# Patient Record
Sex: Female | Born: 1990 | Race: Black or African American | Hispanic: No | Smoking: Never smoker
Health system: Southern US, Community
[De-identification: ages and names within clinical notes are randomized; demographics above are authoritative.]

## PROBLEM LIST (undated history)

## (undated) DIAGNOSIS — O039 Complete or unspecified spontaneous abortion without complication: Secondary | ICD-10-CM

## (undated) DIAGNOSIS — L309 Dermatitis, unspecified: Secondary | ICD-10-CM

## (undated) HISTORY — PX: NO PAST SURGERIES: SHX2092

## (undated) HISTORY — PX: WISDOM TOOTH EXTRACTION: SHX21

## (undated) HISTORY — DX: Dermatitis, unspecified: L30.9

---

## 2011-09-04 ENCOUNTER — Encounter: Payer: Self-pay | Admitting: *Deleted

## 2011-09-04 ENCOUNTER — Emergency Department (HOSPITAL_COMMUNITY)
Admission: EM | Admit: 2011-09-04 | Discharge: 2011-09-04 | Disposition: A | Discharge status: Home / Self Care | Payer: Medicaid Other | Attending: Emergency Medicine | Admitting: Emergency Medicine

## 2011-09-04 ENCOUNTER — Emergency Department (HOSPITAL_COMMUNITY): Payer: Medicaid Other

## 2011-09-04 DIAGNOSIS — M25561 Pain in right knee: Secondary | ICD-10-CM

## 2011-09-04 DIAGNOSIS — M25569 Pain in unspecified knee: Secondary | ICD-10-CM | POA: Insufficient documentation

## 2011-09-04 MED ORDER — IBUPROFEN 600 MG PO TABS: 600.0000 mg | ORAL_TABLET | Freq: Four times a day (QID) | ORAL | Status: AC | PRN

## 2011-09-04 NOTE — ED Provider Notes (Signed)
History     CSN: [id number]  Arrival date & time 09/04/11  1512   First MD Initiated Contact with Patient 09/04/11 1658      Chief Complaint  Patient presents with  . Knee Pain    (Consider location/radiation/quality/duration/timing/severity/associated sxs/prior treatment) Patient is a 21 y.o. female presenting with knee pain. The history is provided by the patient. No language interpreter was used.  Knee Pain This is a new problem. The current episode started in the past 7 days. The problem occurs intermittently. The problem has been gradually worsening. The symptoms are aggravated by bending and walking. She has tried rest for the symptoms. The treatment provided no relief.  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  Patient reports right knee pain for a week, developed swelling just proximal to kneecap yesterday.  No known injury.  History reviewed. No pertinent past medical history.  History reviewed. No pertinent past surgical history.  History reviewed. No pertinent family history.  History  Substance Use Topics  . Smoking status: Never Smoker   . Smokeless tobacco: Not on file  . Alcohol Use: No    OB History    Grav Para Term Preterm Abortions TAB SAB Ect Mult Living                  Review of Systems  All other systems reviewed and are negative.    Allergies  Review of patient's allergies indicates no known allergies.  Home Medications  No current outpatient prescriptions on file.  BP 113/66  Pulse 72  Temp(Src) 98.4 F (36.9 C) (Oral)  Resp 16  SpO2 100%  Physical Exam  Nursing note and vitals reviewed. Constitutional: She is oriented to person, place, and time. She appears well-developed and well-nourished.  HENT:  Head: Normocephalic and atraumatic.  Eyes: Conjunctivae are normal. Pupils are equal, round, and reactive to light.  Neck: Normal range of motion. Neck supple.  Cardiovascular: Normal rate, regular rhythm, normal  heart sounds and intact distal pulses.   Pulmonary/Chest: Effort normal and breath sounds normal.  Abdominal: Soft. Bowel sounds are normal.  Musculoskeletal: Normal range of motion. She exhibits tenderness.  Neurological: She is alert and oriented to person, place, and time.  Skin: Skin is warm and dry.  Psychiatric: She has a normal mood and affect.    ED Course  Procedures (including critical care time)  Labs Reviewed - No data to display No results found.   No diagnosis found.  7:23 PM Radiology results reviewed.  No joint effusion, fracture or lesion.   MDM          Jimmye Norman, NP 09/04/11 [phone]

## 2011-09-04 NOTE — ED Notes (Signed)
Pt reports one week of progressively worsening right knee pain. Pt reports right knee was swollen last night. On exam no swelling. Pt reports pain is constant. Pt reports increase in pain with walking, pt ambulatory in triage. Denies any injury.

## 2011-09-04 NOTE — ED Provider Notes (Signed)
Medical screening examination/treatment/procedure(s) were performed by non-physician practitioner and as supervising physician I was immediately available for consultation/collaboration.   Lannie Heaps, MD 09/04/11 2044 

## 2011-09-04 NOTE — ED Notes (Signed)
Denies injury but presents c/o anterior knee pain. Area tender to touch. Some swelling noted but not bruising. Pt ambulatory, cms/rom intact.

## 2011-09-04 NOTE — ED Notes (Signed)
Returned from xray

## 2011-09-04 NOTE — ED Notes (Signed)
Patient transported to X-ray 

## 2012-02-25 ENCOUNTER — Emergency Department (INDEPENDENT_AMBULATORY_CARE_PROVIDER_SITE_OTHER)
Admission: EM | Admit: 2012-02-25 | Discharge: 2012-02-25 | Disposition: A | Discharge status: Home / Self Care | Payer: Self-pay | Source: Home / Self Care | Attending: Emergency Medicine | Admitting: Emergency Medicine

## 2012-02-25 ENCOUNTER — Encounter (HOSPITAL_COMMUNITY): Payer: Self-pay

## 2012-02-25 DIAGNOSIS — H6690 Otitis media, unspecified, unspecified ear: Secondary | ICD-10-CM

## 2012-02-25 DIAGNOSIS — H6120 Impacted cerumen, unspecified ear: Secondary | ICD-10-CM

## 2012-02-25 MED ORDER — AMOXICILLIN 500 MG PO CAPS: 1000.0000 mg | ORAL_CAPSULE | Freq: Three times a day (TID) | ORAL | Status: AC

## 2012-02-25 NOTE — ED Provider Notes (Signed)
Chief Complaint  Patient presents with  . Otalgia    History of Present Illness:   Tamara Foster is a 21 year old female who has a one-day history of left ear pain and a one-week history of left ear congestion and decreased hearing. She denies any drainage from the ear. The right ear has been normal. She's had no nasal congestion, rhinorrhea, sore throat, cough, or fever. She denies any history of allergies.  Review of Systems:  Other than noted above, the patient denies any of the following symptoms: Systemic:  No fevers, chills, sweats, weight loss or gain, fatigue, or tiredness. Eye:  No redness, pain, discharge, itching, blurred vision, or diplopia. ENT:  No headache, nasal congestion, sneezing, itching, epistaxis, ear pain, congestion, decreased hearing, ringing in ears, vertigo, or tinnitus.  No oral lesions, sore throat, pain on swallowing, or hoarseness. Neck:  No mass, tenderness or adenopathy. Lungs:  No coughing, wheezing, or shortness of breath. Skin:  No rash or itching.  PMFSH:  Past medical history, family history, social history, meds, and allergies were reviewed.  Physical Exam:   Vital signs:  BP 105/63  Pulse 65  Temp 98.5 F (36.9 C) (Oral)  Resp 16  SpO2 100%  LMP 02/06/2012 General:  Alert and oriented.  In no distress.  Skin warm and dry. Eye:  PERRL, full EOMs, lids and conjunctiva normal.   ENT:  Both TMs were occluded by cerumen. This was irrigated clear and thereafter the left TM was red and dull, right TM was normal.  Nasal mucosa not congested and without drainage.  Mucous membranes moist, no oral lesions, normal dentition, pharynx clear.  No cranial or facial pain to palplation. Neck:  Supple, full ROM.  No adenopathy, tenderness or mass.  Thyroid normal. Lungs:  Breath sounds clear and equal bilaterally.  No wheezes, rales or rhonchi. Heart:  Rhythm regular, without extrasystoles.  No gallops or murmers. Skin:  Clear, warm and dry.  Assessment:  The primary  encounter diagnosis was Otitis media. A diagnosis of Cerumen impaction was also pertinent to this visit.  Plan:   1.  The following meds were prescribed:   New Prescriptions   AMOXICILLIN (AMOXIL) 500 MG CAPSULE    Take 2 capsules (1,000 mg total) by mouth 3 (three) times daily.   2.  The patient was instructed in symptomatic care and handouts were given. 3.  The patient was told to return if becoming worse in any way, if no better in 3 or 4 days, and given some red flag symptoms that would indicate earlier return.  Follow up:  The patient was told to follow up here if no better in 2 weeks.       Reuben Likes, MD 02/25/12 [phone]

## 2012-02-25 NOTE — ED Notes (Signed)
C/o pain and decreased hearing to lt ear for 1 week.  Denies fever or cold sx.

## 2012-02-25 NOTE — Discharge Instructions (Signed)
Cerumen Impaction A cerumen impaction is when the wax in your ear forms a plug. This plug usually causes reduced hearing. Sometimes it also causes an earache or dizziness. Removing a cerumen impaction can be difficult and painful. The wax sticks to the ear canal. The canal is sensitive and bleeds easily. If you try to remove a heavy wax buildup with a cotton tipped swab, you may push it in further. Irrigation with water, suction, and small ear curettes may be used to clear out the wax. If the impaction is fixed to the skin in the ear canal, ear drops may be needed for a few days to loosen the wax. People who build up a lot of wax frequently can use ear wax removal products available in your local drugstore. SEEK MEDICAL CARE IF:  You develop an earache, increased hearing loss, or marked dizziness. Document Released: 09/20/2004 Document Revised: 08/02/2011 Document Reviewed: 11/10/2009 [facility] Patient Information 2012 [place], [place].  Otitis Media, Adult A middle ear infection is an infection in the space behind the eardrum. The medical name for this is "otitis media." It may happen after a common cold. It is caused by a germ that starts growing in that space. You may feel swollen glands in your neck on the side of the ear infection. HOME CARE INSTRUCTIONS   Take your medicine as directed until it is gone, even if you feel better after the first few days.   Only take over-the-counter or prescription medicines for pain, discomfort, or fever as directed by your caregiver.   Occasional use of a nasal decongestant a couple times per day may help with discomfort and help the eustachian tube to drain better.  Follow up with your caregiver in 10 to 14 days or as directed, to be certain that the infection has cleared. Not keeping the appointment could result in a chronic or permanent injury, pain, hearing loss and disability. If there is any problem keeping the appointment, you must call back to this  facility for assistance. SEEK IMMEDIATE MEDICAL CARE IF:   You are not getting better in 2 to 3 days.   You have pain that is not controlled with medication.   You feel worse instead of better.   You cannot use the medication as directed.   You develop swelling, redness or pain around the ear or stiffness in your neck.  MAKE SURE YOU:   Understand these instructions.   Will watch your condition.   Will get help right away if you are not doing well or get worse.  Document Released: 05/18/2004 Document Revised: 08/02/2011 Document Reviewed: 03/19/2008 [facility] Patient Information 2012 [place], [place].

## 2012-07-02 LAB — OB RESULTS CONSOLE HIV ANTIBODY (ROUTINE TESTING): HIV: NONREACTIVE

## 2012-07-02 LAB — OB RESULTS CONSOLE GC/CHLAMYDIA
Chlamydia: POSITIVE
Gonorrhea: NEGATIVE

## 2012-07-02 LAB — OB RESULTS CONSOLE ABO/RH
ABO/RH(D): NEGATIVE
RH Type: NEGATIVE

## 2012-07-02 LAB — OB RESULTS CONSOLE RPR: RPR: NONREACTIVE

## 2012-07-02 LAB — OB RESULTS CONSOLE RUBELLA ANTIBODY, IGM: Rubella: IMMUNE

## 2012-07-02 LAB — OB RESULTS CONSOLE HEPATITIS B SURFACE ANTIGEN: Hepatitis B Surface Ag: NEGATIVE

## 2012-07-02 LAB — OB RESULTS CONSOLE ANTIBODY SCREEN: Antibody Screen: NEGATIVE

## 2012-08-18 LAB — OB RESULTS CONSOLE GC/CHLAMYDIA: Chlamydia: NEGATIVE

## 2012-12-18 ENCOUNTER — Encounter (HOSPITAL_COMMUNITY): Payer: Self-pay | Admitting: *Deleted

## 2012-12-18 ENCOUNTER — Inpatient Hospital Stay (HOSPITAL_COMMUNITY)
Admission: AD | Admit: 2012-12-18 | Discharge: 2012-12-18 | Disposition: A | Discharge status: Home / Self Care | Payer: Medicaid Other | Source: Ambulatory Visit | Attending: Obstetrics and Gynecology | Admitting: Obstetrics and Gynecology

## 2012-12-18 ENCOUNTER — Telehealth (HOSPITAL_COMMUNITY): Payer: Self-pay | Admitting: *Deleted

## 2012-12-18 DIAGNOSIS — O479 False labor, unspecified: Secondary | ICD-10-CM | POA: Insufficient documentation

## 2012-12-18 DIAGNOSIS — O368190 Decreased fetal movements, unspecified trimester, not applicable or unspecified: Secondary | ICD-10-CM | POA: Insufficient documentation

## 2012-12-18 DIAGNOSIS — O26859 Spotting complicating pregnancy, unspecified trimester: Secondary | ICD-10-CM | POA: Insufficient documentation

## 2012-12-18 NOTE — MAU Note (Signed)
Pt reports she is cramping and spotting. States cramping all day, spotting since 1pm. Denies problems with pregnancy

## 2012-12-18 NOTE — Telephone Encounter (Signed)
Preadmission screen  

## 2012-12-18 NOTE — MAU Note (Signed)
Pt states her vaginal bleeding is brownish in color -no discharge present at this time-pad in place

## 2012-12-18 NOTE — MAU Note (Signed)
Pt also instructed to call the office in the morning if she still has not felt the baby move

## 2012-12-18 NOTE — MAU Provider Note (Signed)
  History     CSN: [id number]  Arrival date and time: 12/18/12 1938   None     Chief Complaint  Patient presents with  . Labor Eval   HPI  Tamara Foster is a 22 y.o. G1P0 at [redacted]w[redacted]d who presents today with decreased fetal movement, contractions and brown spotting. She states that she had not felt the baby move since 1600 today, but she is feeling it now. She is also having some irregular UCs. She was checked yesterday in the office and was FT. She is now having brown spotting. She denies any LOF.   Past Medical History  Diagnosis Date  . Eczema     Past Surgical History  Procedure Laterality Date  . No past surgeries      Family History  Problem Relation Age of Onset  . Cancer Father     bone  . Diabetes Father   . Cancer Paternal Aunt     breast    History  Substance Use Topics  . Smoking status: Never Smoker   . Smokeless tobacco: Not on file  . Alcohol Use: No    Allergies: No Known Allergies  Prescriptions prior to admission  Medication Sig Dispense Refill  . Prenatal Vit-Fe Fumarate-FA (MULTIVITAMIN-PRENATAL) 27-0.8 MG TABS Take 1 tablet by mouth daily at 12 noon.        Review of Systems  Eyes: Negative for blurred vision.  Gastrointestinal: Negative for nausea, vomiting, abdominal pain, diarrhea and constipation.  Genitourinary: Negative for dysuria, urgency and frequency.  Neurological: Negative for headaches.   Physical Exam   Blood pressure 137/85, pulse 86, temperature 98.6 F (37 C), temperature source Oral, resp. rate 18, height 5\' 5"  (1.651 m), weight 84.823 kg (187 lb), last menstrual period 03/13/2012, SpO2 100.00%.  Physical Exam  Nursing note and vitals reviewed. Constitutional: She is oriented to person, place, and time. She appears well-developed and well-nourished. No distress.  Cardiovascular: Normal rate.   Respiratory: Effort normal.  GI: Soft.  Genitourinary:   External: No lesion Vagina: small amount of brown mucous-y blood  seen Cervix: FT/60/0 Uterus: AGA FHT: Cat I Toco: q 7-10 min ctx  Neurological: She is alert and oriented to person, place, and time.  Skin: Skin is warm and dry.  Psychiatric: She has a normal mood and affect.    MAU Course  Procedures    Assessment and Plan  Pt stable and ready for d/c home  Tawnya Crook 12/18/2012, 8:37 PM    Strip reviewed and shows good variability and accels.  Category 1. Baseline 130 with accels to 150-160 Huel Cote, MD

## 2012-12-19 ENCOUNTER — Inpatient Hospital Stay (HOSPITAL_COMMUNITY)
Admission: AD | Admit: 2012-12-19 | Discharge: 2012-12-19 | Disposition: A | Discharge status: Home / Self Care | Payer: Medicaid Other | Source: Ambulatory Visit | Attending: Obstetrics and Gynecology | Admitting: Obstetrics and Gynecology

## 2012-12-19 ENCOUNTER — Encounter (HOSPITAL_COMMUNITY): Payer: Self-pay | Admitting: *Deleted

## 2012-12-19 DIAGNOSIS — O479 False labor, unspecified: Secondary | ICD-10-CM | POA: Insufficient documentation

## 2012-12-19 MED ORDER — ZOLPIDEM TARTRATE 5 MG PO TABS
5.0000 mg | ORAL_TABLET | Freq: Once | ORAL | Status: AC
  Administered 2012-12-19: 5 mg via ORAL
  Filled 2012-12-19: qty 1

## 2012-12-19 NOTE — MAU Note (Signed)
Pt states her contractions are closer and harder

## 2012-12-19 NOTE — MAU Note (Signed)
Dr Senaida Ores notified of pt status and FHTtracing and SVE-to recheck in 1.5 hrs

## 2012-12-20 ENCOUNTER — Encounter (HOSPITAL_COMMUNITY): Payer: Self-pay | Admitting: Anesthesiology

## 2012-12-20 ENCOUNTER — Encounter (HOSPITAL_COMMUNITY): Payer: Self-pay | Admitting: *Deleted

## 2012-12-20 ENCOUNTER — Inpatient Hospital Stay (HOSPITAL_COMMUNITY)
Admission: AD | Admit: 2012-12-20 | Discharge: 2012-12-22 | DRG: 775 | Disposition: A | Discharge status: Home / Self Care | Payer: Medicaid Other | Source: Ambulatory Visit | Attending: Obstetrics and Gynecology | Admitting: Obstetrics and Gynecology

## 2012-12-20 ENCOUNTER — Inpatient Hospital Stay (HOSPITAL_COMMUNITY): Payer: Medicaid Other | Admitting: Anesthesiology

## 2012-12-20 LAB — CBC
HCT: 31.9 % — ABNORMAL LOW (ref 36.0–46.0)
Hemoglobin: 10.2 g/dL — ABNORMAL LOW (ref 12.0–15.0)
MCH: 23.5 pg — ABNORMAL LOW (ref 26.0–34.0)
MCHC: 32 g/dL (ref 30.0–36.0)
MCV: 73.5 fL — ABNORMAL LOW (ref 78.0–100.0)
Platelets: 167 10*3/uL (ref 150–400)
RBC: 4.34 MIL/uL (ref 3.87–5.11)
RDW: 14.8 % (ref 11.5–15.5)
WBC: 8 10*3/uL (ref 4.0–10.5)

## 2012-12-20 LAB — GROUP B STREP BY PCR: Group B strep by PCR: NEGATIVE

## 2012-12-20 LAB — RPR: RPR Ser Ql: NONREACTIVE

## 2012-12-20 LAB — OB RESULTS CONSOLE GBS: GBS: NEGATIVE

## 2012-12-20 LAB — ABO/RH: ABO/RH(D): B NEG

## 2012-12-20 MED ORDER — LACTATED RINGERS IV SOLN
500.0000 mL | INTRAVENOUS | Status: DC | PRN
  Administered 2012-12-20: 1000 mL via INTRAVENOUS

## 2012-12-20 MED ORDER — ZOLPIDEM TARTRATE 5 MG PO TABS: 5.0000 mg | ORAL_TABLET | Freq: Every evening | ORAL | Status: DC | PRN

## 2012-12-20 MED ORDER — EPHEDRINE 5 MG/ML INJ
10.0000 mg | INTRAVENOUS | Status: DC | PRN
  Filled 2012-12-20: qty 4
  Filled 2012-12-20: qty 2

## 2012-12-20 MED ORDER — CITRIC ACID-SODIUM CITRATE 334-500 MG/5ML PO SOLN: 30.0000 mL | ORAL | Status: DC | PRN

## 2012-12-20 MED ORDER — IBUPROFEN 600 MG PO TABS: 600.0000 mg | ORAL_TABLET | Freq: Four times a day (QID) | ORAL | Status: DC

## 2012-12-20 MED ORDER — TERBUTALINE SULFATE 1 MG/ML IJ SOLN: 0.2500 mg | Freq: Once | INTRAMUSCULAR | Status: DC | PRN

## 2012-12-20 MED ORDER — LACTATED RINGERS IV SOLN: 500.0000 mL | Freq: Once | INTRAVENOUS | Status: DC

## 2012-12-20 MED ORDER — EPHEDRINE 5 MG/ML INJ
10.0000 mg | INTRAVENOUS | Status: DC | PRN
  Filled 2012-12-20: qty 2

## 2012-12-20 MED ORDER — COMPLETENATE 29-1 MG PO CHEW
1.0000 | CHEWABLE_TABLET | Freq: Every day | ORAL | Status: DC
  Administered 2012-12-21: 1 via ORAL
  Filled 2012-12-20 (×4): qty 1

## 2012-12-20 MED ORDER — FLEET ENEMA 7-19 GM/118ML RE ENEM: 1.0000 | ENEMA | RECTAL | Status: DC | PRN

## 2012-12-20 MED ORDER — ONDANSETRON HCL 4 MG/2ML IJ SOLN: 4.0000 mg | Freq: Four times a day (QID) | INTRAMUSCULAR | Status: DC | PRN

## 2012-12-20 MED ORDER — WITCH HAZEL-GLYCERIN EX PADS: 1.0000 "application " | MEDICATED_PAD | CUTANEOUS | Status: DC | PRN

## 2012-12-20 MED ORDER — IBUPROFEN 600 MG PO TABS: 600.0000 mg | ORAL_TABLET | Freq: Four times a day (QID) | ORAL | Status: DC | PRN

## 2012-12-20 MED ORDER — DIBUCAINE 1 % RE OINT: 1.0000 "application " | TOPICAL_OINTMENT | RECTAL | Status: DC | PRN

## 2012-12-20 MED ORDER — OXYTOCIN 40 UNITS IN LACTATED RINGERS INFUSION - SIMPLE MED: 62.5000 mL/h | INTRAVENOUS | Status: DC

## 2012-12-20 MED ORDER — SIMETHICONE 80 MG PO CHEW: 80.0000 mg | CHEWABLE_TABLET | ORAL | Status: DC | PRN

## 2012-12-20 MED ORDER — BENZOCAINE-MENTHOL 20-0.5 % EX AERO
1.0000 "application " | INHALATION_SPRAY | CUTANEOUS | Status: DC | PRN
  Administered 2012-12-21: 1 via TOPICAL
  Filled 2012-12-20 (×3): qty 56

## 2012-12-20 MED ORDER — MEASLES, MUMPS & RUBELLA VAC ~~LOC~~ INJ
0.5000 mL | INJECTION | Freq: Once | SUBCUTANEOUS | Status: DC
  Filled 2012-12-20: qty 0.5

## 2012-12-20 MED ORDER — LIDOCAINE HCL (PF) 1 % IJ SOLN
30.0000 mL | INTRAMUSCULAR | Status: DC | PRN
  Filled 2012-12-20 (×2): qty 30

## 2012-12-20 MED ORDER — OXYCODONE-ACETAMINOPHEN 5-325 MG PO TABS: 1.0000 | ORAL_TABLET | ORAL | Status: DC | PRN

## 2012-12-20 MED ORDER — ONDANSETRON HCL 4 MG/2ML IJ SOLN: 4.0000 mg | INTRAMUSCULAR | Status: DC | PRN

## 2012-12-20 MED ORDER — PHENYLEPHRINE 40 MCG/ML (10ML) SYRINGE FOR IV PUSH (FOR BLOOD PRESSURE SUPPORT)
80.0000 ug | PREFILLED_SYRINGE | INTRAVENOUS | Status: DC | PRN
  Filled 2012-12-20: qty 2

## 2012-12-20 MED ORDER — PRENATAL 27-0.8 MG PO TABS
1.0000 | ORAL_TABLET | Freq: Every day | ORAL | Status: DC
  Filled 2012-12-20 (×4): qty 1

## 2012-12-20 MED ORDER — IBUPROFEN 100 MG/5ML PO SUSP
600.0000 mg | Freq: Four times a day (QID) | ORAL | Status: DC | PRN
  Administered 2012-12-20 – 2012-12-22 (×7): 600 mg via ORAL
  Filled 2012-12-20 (×7): qty 30

## 2012-12-20 MED ORDER — LIDOCAINE HCL (PF) 1 % IJ SOLN
INTRAMUSCULAR | Status: DC | PRN
  Administered 2012-12-20 (×2): 5 mL

## 2012-12-20 MED ORDER — PRENATAL MULTIVITAMIN CH: 1.0000 | ORAL_TABLET | Freq: Every day | ORAL | Status: DC

## 2012-12-20 MED ORDER — LANOLIN HYDROUS EX OINT: TOPICAL_OINTMENT | CUTANEOUS | Status: DC | PRN

## 2012-12-20 MED ORDER — FENTANYL 2.5 MCG/ML BUPIVACAINE 1/10 % EPIDURAL INFUSION (WH - ANES)
14.0000 mL/h | INTRAMUSCULAR | Status: DC | PRN
  Administered 2012-12-20: 14 mL/h via EPIDURAL
  Filled 2012-12-20: qty 125

## 2012-12-20 MED ORDER — SENNOSIDES-DOCUSATE SODIUM 8.6-50 MG PO TABS: 2.0000 | ORAL_TABLET | Freq: Every day | ORAL | Status: DC

## 2012-12-20 MED ORDER — DIPHENHYDRAMINE HCL 25 MG PO CAPS: 25.0000 mg | ORAL_CAPSULE | Freq: Four times a day (QID) | ORAL | Status: DC | PRN

## 2012-12-20 MED ORDER — ACETAMINOPHEN 325 MG PO TABS: 650.0000 mg | ORAL_TABLET | ORAL | Status: DC | PRN

## 2012-12-20 MED ORDER — ONDANSETRON HCL 4 MG PO TABS: 4.0000 mg | ORAL_TABLET | ORAL | Status: DC | PRN

## 2012-12-20 MED ORDER — LACTATED RINGERS IV SOLN
INTRAVENOUS | Status: DC
  Administered 2012-12-20: 125 mL/h via INTRAVENOUS

## 2012-12-20 MED ORDER — DIPHENHYDRAMINE HCL 50 MG/ML IJ SOLN: 12.5000 mg | INTRAMUSCULAR | Status: DC | PRN

## 2012-12-20 MED ORDER — OXYTOCIN 40 UNITS IN LACTATED RINGERS INFUSION - SIMPLE MED: 62.5000 mL/h | INTRAVENOUS | Status: AC | PRN

## 2012-12-20 MED ORDER — PHENYLEPHRINE 40 MCG/ML (10ML) SYRINGE FOR IV PUSH (FOR BLOOD PRESSURE SUPPORT)
80.0000 ug | PREFILLED_SYRINGE | INTRAVENOUS | Status: DC | PRN
  Filled 2012-12-20: qty 2
  Filled 2012-12-20: qty 5

## 2012-12-20 MED ORDER — OXYTOCIN BOLUS FROM INFUSION: 500.0000 mL | INTRAVENOUS | Status: DC

## 2012-12-20 MED ORDER — TETANUS-DIPHTH-ACELL PERTUSSIS 5-2.5-18.5 LF-MCG/0.5 IM SUSP: 0.5000 mL | Freq: Once | INTRAMUSCULAR | Status: DC

## 2012-12-20 MED ORDER — LACTATED RINGERS IV SOLN: INTRAVENOUS | Status: AC

## 2012-12-20 MED ORDER — OXYTOCIN 40 UNITS IN LACTATED RINGERS INFUSION - SIMPLE MED
1.0000 m[IU]/min | INTRAVENOUS | Status: DC
  Administered 2012-12-20: 1 m[IU]/min via INTRAVENOUS
  Filled 2012-12-20: qty 1000

## 2012-12-20 NOTE — Anesthesia Procedure Notes (Signed)
Epidural Patient location during procedure: OB Start time: 12/20/2012 8:06 AM  Staffing Anesthesiologist: Angus Seller., Harrell Gave. Performed by: anesthesiologist   Preanesthetic Checklist Completed: patient identified, site marked, surgical consent, pre-op evaluation, timeout performed, IV checked, risks and benefits discussed and monitors and equipment checked  Epidural Patient position: sitting Prep: site prepped and draped and DuraPrep Patient monitoring: continuous pulse ox and blood pressure Approach: midline Injection technique: LOR air and LOR saline  Needle:  Needle type: Tuohy  Needle gauge: 17 G Needle length: 9 cm and 9 Needle insertion depth: 6 cm Catheter type: closed end flexible Catheter size: 19 Gauge Catheter at skin depth: 12 cm Test dose: negative  Assessment Events: blood not aspirated, injection not painful, no injection resistance, negative IV test and no paresthesia  Additional Notes Patient identified.  Risk benefits discussed including failed block, incomplete pain control, headache, nerve damage, paralysis, blood pressure changes, nausea, vomiting, reactions to medication both toxic or allergic, and postpartum back pain.  Patient expressed understanding and wished to proceed.  All questions were answered.  Sterile technique used throughout procedure and epidural site dressed with sterile barrier dressing. No paresthesia or other complications noted.The patient did not experience any signs of intravascular injection such as tinnitus or metallic taste in mouth nor signs of intrathecal spread such as rapid motor block. Please see nursing notes for vital signs.

## 2012-12-20 NOTE — Progress Notes (Signed)
Lactation consultant  Called and requested to assist pt

## 2012-12-20 NOTE — Anesthesia Preprocedure Evaluation (Signed)

## 2012-12-20 NOTE — H&P (Signed)
Tamara Foster, Tamara Foster               ACCOUNT NO.:  [id number]  MEDICAL RECORD NO.:  [id number]  LOCATION:  9167                          FACILITY:  [facility]  PHYSICIAN:  Malachi Pro. Ambrose Mantle, M.D. DATE OF BIRTH:  02/27/1991  DATE OF ADMISSION:  12/20/2012 DATE OF DISCHARGE:                             HISTORY & PHYSICAL   HISTORY OF PRESENT ILLNESS:  This is a 22 year old black female, para 0, gravida 1 with [facility] December 18, 2012, admitted in early labor.  Blood group and type B negative with a negative antibody.  Pap smear normal. Rubella immune.  RPR nonreactive.  Urine culture negative.  Hepatitis B surface antigen negative, HIV negative.  GC and Chlamydia:  GC was negative.  Chlamydia positive treated with Zithromax 1 g.  Hemoglobin AA.  Quad screen negative.  One-hour Glucola was 119.  The patient does not know her group B strep status, and we do not find it in the prenatal record.  PAST MEDICAL HISTORY:  Eczema.  SURGICAL HISTORY:  None.  ALLERGIES:  No known allergies.  No latex allergy.  FAMILY HISTORY:  Father 39 with bone cancer and 40 breast cancer. Father at 71 diabetes.  The patient states that she never smoked cigarettes, never used drugs.  Formerly drank some alcohol.  PHYSICAL EXAMINATION:  VITAL SIGNS:  On admission, temperature 97.4, pulse 78, respirations 16, blood pressure 115/53. HEART:  Normal size and sounds.  No murmurs. LUNGS:  Clear to auscultation. GU:  Fundal height compatible with dates.  Cervix 4 cm, 100% vertex at a -1 to 0 station.  Artificial rupture of the membranes produced clear fluid.  ADMITTING IMPRESSION:  Intrauterine pregnancy at 40 weeks, early labor. This patient had been seen in maternity admission unit twice on Thursday night and early Friday morning.  She came to our office Friday during the day, all with false labor.  No cervical progression, but when she came to maternity admission unit today, the cervix had definitely changed to 3-4 cm  per the RN on duty.  The patient had an unknown group B strep status for term pregnancy with a known group B strep status there, treated with penicillin only.  If there are other factors that put her at high risk, which she has none, so rapid group B strep was done and was negative.  The patient has received an epidural.  I have ruptured her membranes with clear fluid, and she has been started on Pitocin to see if we can get adequate cervical progression.     Malachi Pro. Ambrose Mantle, M.D.     TFH/MEDQ  D:  12/20/2012  T:  12/20/2012  Job:  [id number]

## 2012-12-20 NOTE — Progress Notes (Signed)
Patient ID: Tamara Foster, female   DOB: 1990/10/16, 22 y.o.   MRN: [id number] Delivery note:  The progressed to full dilatation and pushed well. She delivered spontaneously ROA over a second degree ML laceration a living female infant with Apgars 8 and 9 at 1 and 5 minutes. The placenta was intact and the uterus was normal. The laceration was repaired with 3-0 vicryl. The uterus was firm and well contracted but continued to bleed some so I massaged it for several minutes. 1% xylocaine was used for the repair. EBL 400 cc's.

## 2012-12-20 NOTE — MAU Note (Signed)
Pt states has been contracting allnight and has not slept

## 2012-12-20 NOTE — Progress Notes (Signed)
Lactation consultant here

## 2012-12-21 LAB — CBC
HCT: 28.5 % — ABNORMAL LOW (ref 36.0–46.0)
Hemoglobin: 9.3 g/dL — ABNORMAL LOW (ref 12.0–15.0)
MCH: 24.2 pg — ABNORMAL LOW (ref 26.0–34.0)
MCHC: 32.6 g/dL (ref 30.0–36.0)
MCV: 74.2 fL — ABNORMAL LOW (ref 78.0–100.0)
Platelets: 139 10*3/uL — ABNORMAL LOW (ref 150–400)
RBC: 3.84 MIL/uL — ABNORMAL LOW (ref 3.87–5.11)
RDW: 15 % (ref 11.5–15.5)
WBC: 9.4 10*3/uL (ref 4.0–10.5)

## 2012-12-21 MED ORDER — RHO D IMMUNE GLOBULIN 1500 UNIT/2ML IJ SOLN
300.0000 ug | Freq: Once | INTRAMUSCULAR | Status: AC
  Administered 2012-12-21: 300 ug via INTRAMUSCULAR
  Filled 2012-12-21: qty 2

## 2012-12-21 MED ORDER — OXYCODONE-ACETAMINOPHEN 5-325 MG/5ML PO SOLN
5.0000 mL | ORAL | Status: DC | PRN
  Administered 2012-12-21 – 2012-12-22 (×3): 5 mL via ORAL
  Filled 2012-12-21 (×3): qty 5

## 2012-12-21 NOTE — Anesthesia Postprocedure Evaluation (Signed)
  Anesthesia Post-op Note  Patient: Tamara Foster  Procedure(s) Performed: * No procedures listed *  Patient Location: PACU and Mother/Baby  Anesthesia Type:Epidural  Level of Consciousness: awake, alert  and oriented  Airway and Oxygen Therapy: Patient Spontanous Breathing  Post-op Pain: mild  Post-op Assessment: Patient's Cardiovascular Status Stable, Respiratory Function Stable, No signs of Nausea or vomiting, Adequate PO intake and Pain level controlled  Post-op Vital Signs: stable  Complications: No apparent anesthesia complications

## 2012-12-21 NOTE — Progress Notes (Signed)
Patient ID: Tamara Foster, female   DOB: 08-Feb-1991, 22 y.o.   MRN: [id number] #1 afebrile BP normal No complaints

## 2012-12-22 ENCOUNTER — Inpatient Hospital Stay (HOSPITAL_COMMUNITY): Admission: RE | Admit: 2012-12-22 | Payer: Medicaid Other | Source: Ambulatory Visit

## 2012-12-22 LAB — RH IG WORKUP (INCLUDES ABO/RH)
ABO/RH(D): B NEG
Antibody Screen: NEGATIVE
Fetal Screen: NEGATIVE
Gestational Age(Wks): 40.2
Unit division: 0

## 2012-12-22 MED ORDER — OXYCODONE-ACETAMINOPHEN 5-325 MG/5ML PO SOLN: 5.0000 mL | Freq: Four times a day (QID) | ORAL | Status: DC | PRN

## 2012-12-22 MED ORDER — IBUPROFEN 100 MG/5ML PO SUSP: 600.0000 mg | Freq: Four times a day (QID) | ORAL | Status: DC | PRN

## 2012-12-22 NOTE — Progress Notes (Signed)
Ur chart review completed.  

## 2012-12-22 NOTE — Progress Notes (Signed)
Patient ID: Tamara Foster, female   DOB: 1991/08/27, 22 y.o.   MRN: [id number] #[id number] afebrile No problems ready for d/c.

## 2012-12-26 ENCOUNTER — Ambulatory Visit (HOSPITAL_COMMUNITY)
Admit: 2012-12-26 | Discharge: 2012-12-26 | Disposition: A | Discharge status: Home / Self Care | Payer: Medicaid Other | Attending: Obstetrics and Gynecology | Admitting: Obstetrics and Gynecology

## 2012-12-26 NOTE — Lactation Note (Signed)
Adult Lactation Consultation Outpatient Visit Note  Patient Name: Tamara Foster  Baby's name: Ava Pate Date of Birth: 05/15/1991   BW: 7# 13.2 oz Gestational Age at Delivery: Unknown Today's weight: 7# 9.5oz Type of Delivery: Vag  Breastfeeding History: Frequency of Breastfeeding: N/A. l  Length of Feeding:  Voids: light yellow Stools: yellow, brownish scant amount today (1-today, 2 the day before)  Supplementing / Method: Pumping:  Type of Pump: hand pump   Frequency: 20-30 min on each side; just began  Volume: scant amount  Comments: Formula 3 oz q3h   Consultation Evaluation:  Initial Feeding Assessment: Pre-feed Weight: 3444g Post-feed Weight: 3460g Amount Transferred:47mL (some of which was formula) Comments:R breast, w/size 20 NS; 0925; 0930: curved tip syringe: 0935: formula; 0945: off   Total Supplement Given: 70 mL+ (there had been spillage w/SNS)   Follow-Up Baby has gained 5.5 oz over the last 2 days.  Baby has not fed at the breast since discharge (Mom attempted once at home, but she wouldn't latch).  Mom has been formula feeding.  Mom went 2 days without any breast stimulation & Mom has only just begun using a hand pump.  Mom reports that she did feel as if her milk came in, but now her breasts are soft.  Mom only gets a scant amount w/the hand pump. Mom's meds are significant for: percocet 1 q4-6h prn, which she takes for back pain.   Mom has flat nipples & baby was put to breast w/a size 20 nipple shield.  Baby latched only briefly, but became fussy secondary to lack of flow.  The nipple-shield was prefilled w/formula & baby did latch & suckle briefly, but only until that formula had been consumed.  An SNS was attempted, but this feeding did not work well and Mom did not seem to be a good candidate for the SNS at this time.    Based on this feeding & baby's risk for toxic levels of jaundice (b/c of the cephalohematoma) it seemed more prudent to continue feeding  baby via bottle at this time.  Mom understands that she may have missed the window for a milk supply, but we will attempt to regain it by pumping 8 times/day & not allowing more than 5 hours to elapse between pumping sessions. Mom given a [facility] loaner ([employer]) & shown how to use, clean, and assemble parts.  Recipe for lactation cookies also shared.   Mom is to call for a f/u lactation appt once baby's risk for jaundice has decreased.  Mom is aware that any breastmilk pumped should be given to the baby, but not mixed w/formula.     Lurline Hare [facility] : [facility] 12/26/2012, 9:05 AM

## 2013-02-03 NOTE — Discharge Summary (Signed)
NAMEESBEYDI, Tamara Foster               ACCOUNT NO.:  [id number]  MEDICAL RECORD NO.:  [id number]  LOCATION:  9135                          FACILITY:  [facility]  PHYSICIAN:  Malachi Pro. Ambrose Mantle, M.D. DATE OF BIRTH:  05/20/1991  DATE OF ADMISSION:  12/20/2012 DATE OF DISCHARGE:  12/22/2012                              DISCHARGE SUMMARY   HISTORY OF PRESENT ILLNESS:  A 22 year old black female para 0, gravida 1, EDC December 18, 2012, admitted in early labor.  The patient had been treated for Chlamydia during pregnancy.  She had been seen in the maternity admission unit twice on 2 days prior to admission and once 1 day prior to admission.  She came to our office during the day, all with false labor.  There was no cervical progression.  When she came to the maternity admission unit on the day of delivery, the cervix had definitely changed to 3-4 cm per the RN on duty.  A rapid group B strep was done.  The patient began labor.  She was started on Pitocin.  She progressed to full dilatation and pushed well.  She delivered spontaneously, ROA over a second-degree midline laceration by Dr. Ambrose Mantle, a living female infant with Apgars of 8 and 9 at 1 and 5 minutes.  Placenta was intact.  Uterus was normal.  Laceration repaired with 3-0 Vicryl.  Uterus was firm and well contracted.  But continued to bleed some, so I massaged for several minutes.  Xylocaine 1% was used for repair.  Blood loss about 400 mL.  Postpartum, the patient did well, had no significant problems and on the second postpartum day was ready for discharge.  Hemoglobin was 9.3, hematocrit 28.5, white count 9400, platelet count 139,000.  Blood group and type B negative, negative antibody.  The patient was given RhoGAM, and on the second postpartum day was ready for discharge.  FINAL DIAGNOSES:  Intrauterine pregnancy at 40+ weeks.  Delivered ROA.  OPERATION:  Spontaneous delivery ROA, repair of second-degree midline laceration.  FINAL  CONDITION:  Improved.  INSTRUCTIONS:  A regular discharge instruction booklet as well as the after visit summary.  She was also given prescriptions for analgesics. She is asked to return to the office in 6 weeks for followup examination and take prenatal vitamins as well as ferrous sulfate 325 mg twice daily.     Malachi Pro. Ambrose Mantle, M.D.     TFH/MEDQ  D:  02/02/2013  T:  02/03/2013  Job:  [id number]

## 2013-04-28 ENCOUNTER — Emergency Department (HOSPITAL_COMMUNITY)
Admission: EM | Admit: 2013-04-28 | Discharge: 2013-04-29 | Disposition: A | Discharge status: Home / Self Care | Payer: Medicaid Other | Attending: Emergency Medicine | Admitting: Emergency Medicine

## 2013-04-28 ENCOUNTER — Emergency Department (HOSPITAL_COMMUNITY): Payer: Medicaid Other

## 2013-04-28 ENCOUNTER — Encounter (HOSPITAL_COMMUNITY): Payer: Self-pay | Admitting: Emergency Medicine

## 2013-04-28 DIAGNOSIS — Z872 Personal history of diseases of the skin and subcutaneous tissue: Secondary | ICD-10-CM | POA: Insufficient documentation

## 2013-04-28 DIAGNOSIS — R0789 Other chest pain: Secondary | ICD-10-CM | POA: Insufficient documentation

## 2013-04-28 DIAGNOSIS — F419 Anxiety disorder, unspecified: Secondary | ICD-10-CM

## 2013-04-28 DIAGNOSIS — F411 Generalized anxiety disorder: Secondary | ICD-10-CM | POA: Insufficient documentation

## 2013-04-28 DIAGNOSIS — R0602 Shortness of breath: Secondary | ICD-10-CM | POA: Insufficient documentation

## 2013-04-28 LAB — CBC WITH DIFFERENTIAL/PLATELET
Basophils Absolute: 0 10*3/uL (ref 0.0–0.1)
Basophils Relative: 0 % (ref 0–1)
Eosinophils Absolute: 0 10*3/uL (ref 0.0–0.7)
Eosinophils Relative: 0 % (ref 0–5)
HCT: 36 % (ref 36.0–46.0)
Hemoglobin: 12.2 g/dL (ref 12.0–15.0)
Lymphocytes Relative: 43 % (ref 12–46)
Lymphs Abs: 2.5 10*3/uL (ref 0.7–4.0)
MCH: 26.5 pg (ref 26.0–34.0)
MCHC: 33.9 g/dL (ref 30.0–36.0)
MCV: 78.3 fL (ref 78.0–100.0)
Monocytes Absolute: 0.4 10*3/uL (ref 0.1–1.0)
Monocytes Relative: 7 % (ref 3–12)
Neutro Abs: 2.9 10*3/uL (ref 1.7–7.7)
Neutrophils Relative %: 49 % (ref 43–77)
Platelets: 182 10*3/uL (ref 150–400)
RBC: 4.6 MIL/uL (ref 3.87–5.11)
RDW: 14.6 % (ref 11.5–15.5)
WBC: 5.9 10*3/uL (ref 4.0–10.5)

## 2013-04-28 LAB — BASIC METABOLIC PANEL
BUN: 14 mg/dL (ref 6–23)
CO2: 22 mEq/L (ref 19–32)
Calcium: 9.5 mg/dL (ref 8.4–10.5)
Chloride: 106 mEq/L (ref 96–112)
Creatinine, Ser: 0.67 mg/dL (ref 0.50–1.10)
GFR calc Af Amer: >90 mL/min (ref >90)
GFR calc non Af Amer: >90 mL/min (ref >90)
Glucose, Bld: 85 mg/dL (ref 70–99)
Potassium: 3.4 mEq/L — ABNORMAL LOW (ref 3.5–5.1)
Sodium: 140 mEq/L (ref 135–145)

## 2013-04-28 MED ORDER — ALBUTEROL SULFATE HFA 108 (90 BASE) MCG/ACT IN AERS: 2.0000 | INHALATION_SPRAY | RESPIRATORY_TRACT | Status: DC | PRN

## 2013-04-28 MED ORDER — LORAZEPAM 1 MG PO TABS
1.0000 mg | ORAL_TABLET | Freq: Once | ORAL | Status: AC
  Administered 2013-04-28: 1 mg via ORAL
  Filled 2013-04-28: qty 1

## 2013-04-28 NOTE — ED Notes (Signed)
Pt.reports exertional dyspnea with chest tightness onset yesterday .

## 2013-04-28 NOTE — ED Notes (Addendum)
Pt states that she has been experiencing chest tightness since last night.She states there are no other symptoms. Pt states that she has been under a lot of stress lately.

## 2013-04-28 NOTE — ED Provider Notes (Signed)
CSN: [id number]     Arrival date & time 04/28/13  1940 History   First MD Initiated Contact with Patient 04/28/13 2241     Chief Complaint  Patient presents with  . Shortness of Breath   (Consider location/radiation/quality/duration/timing/severity/associated sxs/prior Treatment) HPI Comments: Patient presents with a chief complaint of chest tightness and shortness of breath.  She reports that her symptoms have been present since yesterday and are gradually worsening.  She denies any history of Asthma.  Denies fever, chills, cough, hemoptysis, lower extremity edema, nausea, vomiting, or any other symptoms.  She does report that she has been feeling very "stressed out" recently.  She denies any prior cardiac history.  Denies history of DVT or PE.  Denies lower extremity edema or pain.  She reports that she is on Depo- Provera, but is currently not on any estrogen containing medications.  Denies history of Cancer.  Denies prolonged travel or surgeries in the past 4 weeks.    The history is provided by the patient.    Past Medical History  Diagnosis Date  . Eczema    History reviewed. No pertinent past surgical history. Family History  Problem Relation Age of Onset  . Cancer Father     bone  . Diabetes Father   . Cancer Paternal Aunt     breast   History  Substance Use Topics  . Smoking status: Never Smoker   . Smokeless tobacco: Not on file  . Alcohol Use: No   OB History   Grav Para Term Preterm Abortions TAB SAB Ect Mult Living   1 1 1       1      Review of Systems  Constitutional: Negative for fever and chills.  Respiratory: Positive for chest tightness and shortness of breath. Negative for cough and wheezing.   All other systems reviewed and are negative.    Allergies  Review of patient's allergies indicates no known allergies.  Home Medications   Current Outpatient Rx  Name  Route  Sig  Dispense  Refill  . MedroxyPROGESTERone Acetate (DEPO-PROVERA IM)  Intramuscular   Inject into the muscle every 3 (three) months.          BP 120/72  Pulse 67  Temp(Src) 99.1 F (37.3 C) (Oral)  Resp 19  SpO2 100%  LMP 04/15/2013 Physical Exam  Nursing note and vitals reviewed. Constitutional: She appears well-developed and well-nourished.  HENT:  Head: Normocephalic and atraumatic.  Mouth/Throat: Oropharynx is clear and moist.  Neck: Normal range of motion. Neck supple.  Cardiovascular: Normal rate, regular rhythm, normal heart sounds and intact distal pulses.   Pulmonary/Chest: Effort normal and breath sounds normal. No respiratory distress. She has no wheezes. She has no rales. She exhibits no tenderness.  Abdominal: Soft. There is no tenderness.  Musculoskeletal:  No lower extremity edema bilaterally  Neurological: She is alert.  Skin: Skin is warm and dry.  Psychiatric: She has a normal mood and affect.    ED Course  Procedures (including critical care time) Labs Review Labs Reviewed - No data to display Imaging Review Dg Chest 2 View  04/29/2013   *RADIOLOGY REPORT*  Clinical Data: Shortness of breath, chest pain  CHEST - 2 VIEW  Comparison: None.  Findings: Cardiac and mediastinal silhouettes are within normal limits.  The lungs are normally inflated without airspace consolidation, pleural effusion, or pulmonary edema.  No pneumothorax.  No acute osseous abnormality identified within the thorax.  IMPRESSION: No acute cardiopulmonary process.  Original Report Authenticated By: Rise Mu, M.D.    MDM  No diagnosis found. Patient presenting with a chief complaint of chest pain and SOB that has been present since yesterday.  Lungs CTAB.  CXR negative.  Labs unremarkable. PERC negative.  She does report that she has been more "stressed out" recently.  Patient given Ativan in the ED, which did help with her symptoms.  Feel that the patient is stable for discharge.  Patient instructed to follow up with PCP.   Strict return  precautions given.      Pascal Lux [place], PA-C 04/29/13 2253

## 2013-04-30 NOTE — ED Provider Notes (Signed)
I saw and evaluated the patient, reviewed the resident's note and I agree with the findings and plan.  Doubt ACS, PE (PERC neg), pneumonia, Dissection, arrhythmia, or other emergent etiology.  Suspect symptoms are multifactorial and possible anxiety component involved.  On discharge pt was symptom free and in no distress.  She agreed with plan.  F/U PCM.  ER precautions given.  Darlys Gales, MD 04/30/13 [phone]

## 2013-05-01 ENCOUNTER — Emergency Department (HOSPITAL_COMMUNITY)
Admission: EM | Admit: 2013-05-01 | Discharge: 2013-05-01 | Disposition: A | Discharge status: Home / Self Care | Payer: Medicaid Other | Source: Home / Self Care | Attending: Family Medicine | Admitting: Family Medicine

## 2013-05-01 ENCOUNTER — Encounter (HOSPITAL_COMMUNITY): Payer: Self-pay | Admitting: Emergency Medicine

## 2013-05-01 DIAGNOSIS — R071 Chest pain on breathing: Secondary | ICD-10-CM

## 2013-05-01 DIAGNOSIS — R0789 Other chest pain: Secondary | ICD-10-CM

## 2013-05-01 MED ORDER — TRAMADOL HCL 50 MG PO TABS: 50.0000 mg | ORAL_TABLET | Freq: Four times a day (QID) | ORAL | Status: DC | PRN

## 2013-05-01 MED ORDER — GI COCKTAIL ~~LOC~~
ORAL | Status: AC
  Filled 2013-05-01: qty 30

## 2013-05-01 NOTE — ED Notes (Signed)
Patient reports anxiety concerns since 9/1.  Patient reports seen in [facility] on 9/2-diagnosed with anxiety.

## 2013-05-01 NOTE — ED Provider Notes (Addendum)
CSN: [id number]     Arrival date & time 05/01/13  1449 History   First MD Initiated Contact with Patient 05/01/13 1558     Chief Complaint  Patient presents with  . Anxiety   (Consider location/radiation/quality/duration/timing/severity/associated sxs/prior Treatment) Patient is a 22 y.o. female presenting with anxiety. The history is provided by the patient and a relative.  Anxiety This is a new problem. The current episode started more than 2 days ago (seen 9/2 in ER for same since 9/1, admits to stress--new mother etc.). The problem has not changed since onset.Associated symptoms include chest pain. Pertinent negatives include no abdominal pain and no shortness of breath.    Past Medical History  Diagnosis Date  . Eczema    History reviewed. No pertinent past surgical history. Family History  Problem Relation Age of Onset  . Cancer Father     bone  . Diabetes Father   . Cancer Paternal Aunt     breast   History  Substance Use Topics  . Smoking status: Never Smoker   . Smokeless tobacco: Not on file  . Alcohol Use: No   OB History   Grav Para Term Preterm Abortions TAB SAB Ect Mult Living   1 1 1       1      Review of Systems  Constitutional: Negative.   Respiratory: Negative for shortness of breath.   Cardiovascular: Positive for chest pain. Negative for palpitations and leg swelling.  Gastrointestinal: Negative.  Negative for abdominal pain.    Allergies  Review of patient's allergies indicates no known allergies.  Home Medications   Current Outpatient Rx  Name  Route  Sig  Dispense  Refill  . MedroxyPROGESTERone Acetate (DEPO-PROVERA IM)   Intramuscular   Inject into the muscle every 3 (three) months.         . traMADol (ULTRAM) 50 MG tablet   Oral   Take 1 tablet (50 mg total) by mouth every 6 (six) hours as needed for pain.   15 tablet   0    BP 106/65  Pulse 63  Temp(Src) 99.9 F (37.7 C) (Oral)  Resp 16  SpO2 100%  LMP 04/20/2013 Physical  Exam  Nursing note and vitals reviewed. Constitutional: She is oriented to person, place, and time. She appears well-developed and well-nourished.  HENT:  Head: Normocephalic.  Neck: Normal range of motion. Neck supple.  Cardiovascular: Normal rate, regular rhythm, normal heart sounds and intact distal pulses.   Pulmonary/Chest: Effort normal and breath sounds normal. She exhibits tenderness.  Abdominal: Soft. Bowel sounds are normal.  Neurological: She is alert and oriented to person, place, and time.  Skin: Skin is warm and dry.  Psychiatric: She has a normal mood and affect.    ED Course  Procedures (including critical care time) Labs Review Labs Reviewed - No data to display Imaging Review No results found.  MDM  [id number].     Linna Hoff, MD 05/01/13 1630  Linna Hoff, MD 05/01/13 [phone]

## 2013-05-14 ENCOUNTER — Ambulatory Visit: Payer: Medicaid Other | Admitting: Internal Medicine

## 2013-06-01 ENCOUNTER — Encounter: Payer: Self-pay | Admitting: Internal Medicine

## 2013-06-01 ENCOUNTER — Ambulatory Visit: Payer: Medicaid Other | Attending: Internal Medicine | Admitting: Internal Medicine

## 2013-06-01 VITALS — BP 108/68 | HR 78 | Temp 98.6°F | Resp 16 | Ht 64.96 in | Wt 153.0 lb

## 2013-06-01 DIAGNOSIS — O9220 Unspecified disorder of breast associated with pregnancy and the puerperium: Secondary | ICD-10-CM

## 2013-06-01 DIAGNOSIS — Z0000 Encounter for general adult medical examination without abnormal findings: Secondary | ICD-10-CM

## 2013-06-01 DIAGNOSIS — N9489 Other specified conditions associated with female genital organs and menstrual cycle: Secondary | ICD-10-CM | POA: Insufficient documentation

## 2013-06-01 DIAGNOSIS — Z23 Encounter for immunization: Secondary | ICD-10-CM

## 2013-06-01 NOTE — Progress Notes (Signed)
Pt is here to est care. Needing a referral to a GYN and also c/o a mass around rectum Denies: fevers, pain, drainage Alert w/no signs of acute distress.

## 2013-06-01 NOTE — Progress Notes (Signed)
Patient ID: Tamara Foster, female   DOB: [birth year], 21 y.o.   MRN: [id number]   HPI: Pt presents for a Gyn referral. She delivered a baby in [place] and was under the care of  Ob/ Gyn. She went for a follow up visit and was noted to have a swelling/ mass in the skin around the vaginal area. Her doctor told her that he would like to monitor it and would see her again in follow up. She then states that her Medicaid will not let her see him again without a PCP referral.  No other issues. She is not breast feeding.  No Known Allergies Past Medical History  Diagnosis Date  . Eczema    Prior to Admission medications   Medication Sig Start Date End Date Taking? Authorizing Provider  MedroxyPROGESTERone Acetate (DEPO-PROVERA IM) Inject into the muscle every 3 (three) months.   Yes Historical Provider, MD  traMADol (ULTRAM) 50 MG tablet Take 1 tablet (50 mg total) by mouth every 6 (six) hours as needed for pain. 05/01/13   Linna Hoff, MD   Family History  Problem Relation Age of Onset  . Cancer Father     bone  . Diabetes Father   . Cancer Paternal Aunt     breast   History   Social History  . Marital Status: Single    Spouse Name: N/A    Number of Children: N/A  . Years of Education: N/A   Occupational History  . Not on file.   Social History Main Topics  . Smoking status: Never Smoker   . Smokeless tobacco: Not on file  . Alcohol Use: No  . Drug Use: No  . Sexual Activity: Not on file   Other Topics Concern  . Not on file   Social History Narrative  . No narrative on file      Objective:   Filed Vitals:   06/01/13 1220  BP: 108/68  Pulse: 78  Temp: 98.6 F (37 C)  Resp: 16    Physical Exam ______ Constitutional: Appears well-developed and well-nourished. No distress. ____ HENT: Normocephalic. External right and left ear normal. Oropharynx is clear and moist. ____ Eyes: Conjunctivae and EOM are normal. PERRLA, no scleral icterus. ____ Neck: Normal  ROM. Neck supple. No JVD. No tracheal deviation. No thyromegaly. ____ CVS: RRR, S1/S2 +, no murmurs, no gallops, no carotid bruit.  Pulmonary: Effort and breath sounds normal, no stridor, rhonchi, wheezes, rales.  Abdominal: Soft. BS +,  no distension, tenderness, rebound or guarding. ________ Musculoskeletal: Normal range of motion. No edema and no tenderness. ____ Psychiatric: Normal mood and affect. Behavior, judgment, thought content normal. __  Lab Results  Component Value Date   WBC 5.9 04/28/2013   HGB 12.2 04/28/2013   HCT 36.0 04/28/2013   MCV 78.3 04/28/2013   PLT 182 04/28/2013   Lab Results  Component Value Date   CREATININE 0.67 04/28/2013   BUN 14 04/28/2013   NA 140 04/28/2013   K 3.4* 04/28/2013   CL 106 04/28/2013   CO2 22 04/28/2013    No results found for this basename: HGBA1C   Lipid Panel  No results found for this basename: chol, trig, hdl, cholhdl, vldl, ldlcalc       Assessment and plan:   There are no active problems to display for this patient.  Referral to Ob/Gyn given.   Flu shot administered.

## 2014-01-16 IMAGING — CR DG CHEST 2V
2 series · 2 of 2 positions shown · non-contrast
Comparison: None.

CLINICAL DATA: Shortness of breath, chest pain

CHEST - 2 VIEW

[w chest pa]
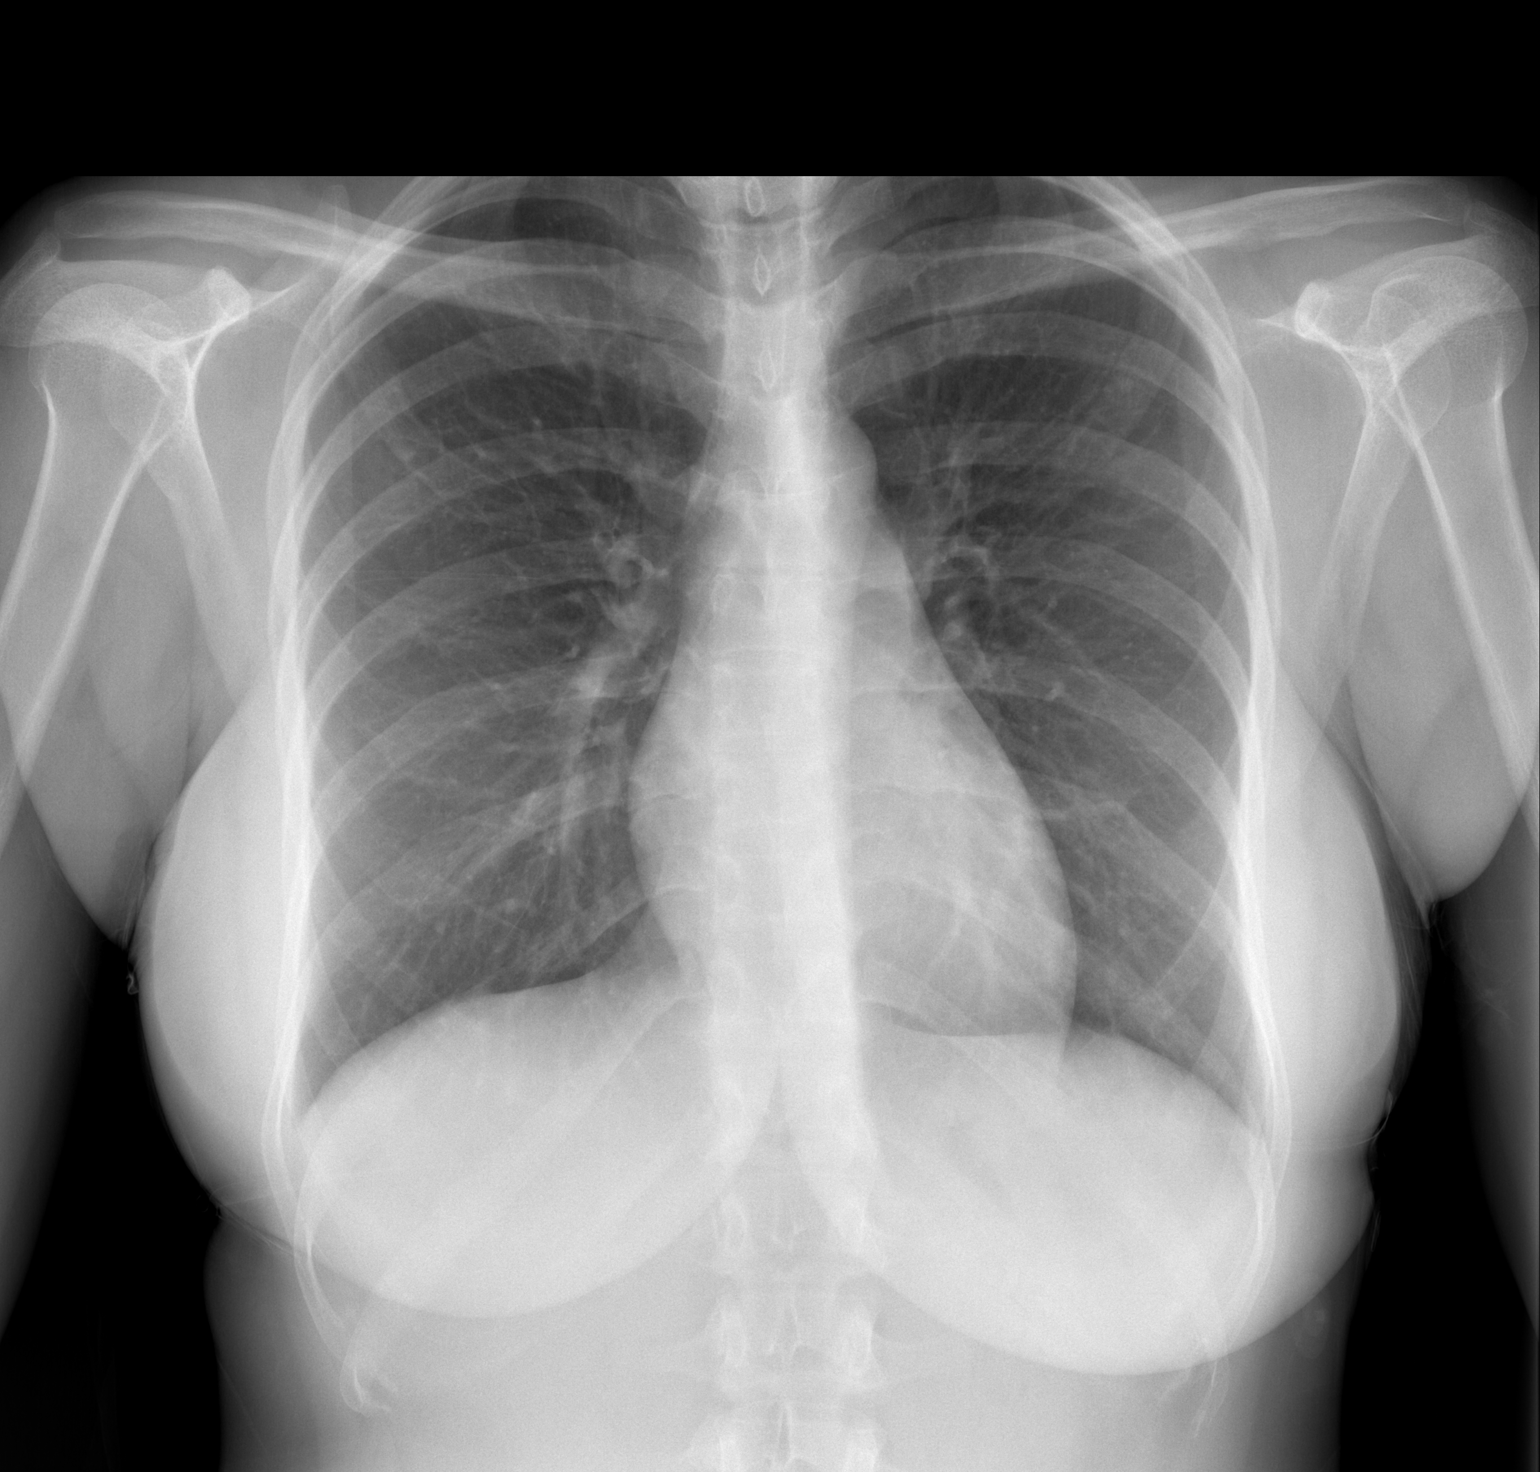

[w chest lat]
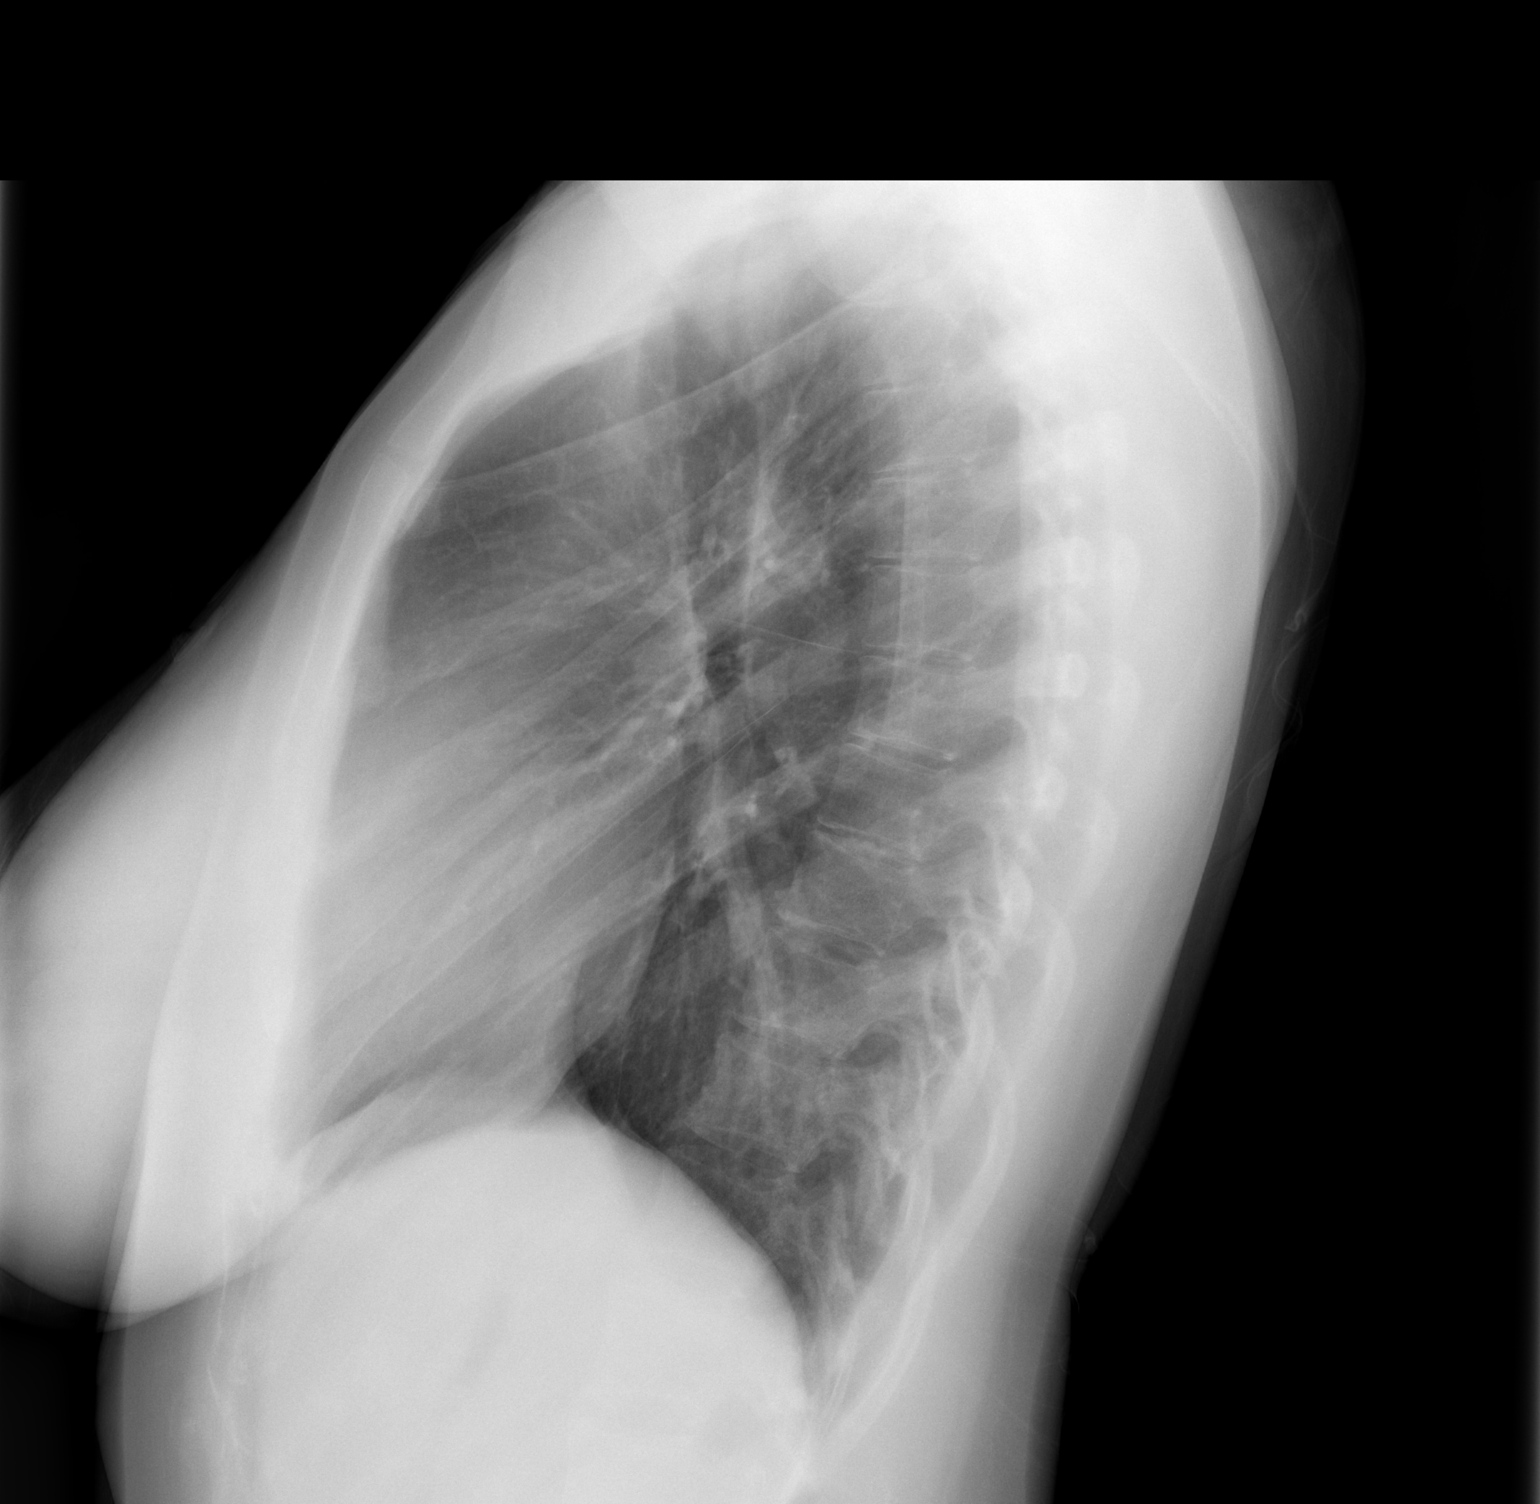

[2 of 2 positions shown; findings below may reference images not displayed]

FINDINGS: Cardiac and mediastinal silhouettes are within normal
limits.

The lungs are normally inflated without airspace consolidation,
pleural effusion, or pulmonary edema.  No pneumothorax.

No acute osseous abnormality identified within the thorax.
IMPRESSION: No acute cardiopulmonary process.

## 2014-05-08 ENCOUNTER — Encounter (HOSPITAL_COMMUNITY): Payer: Self-pay | Admitting: Emergency Medicine

## 2014-05-08 ENCOUNTER — Emergency Department (HOSPITAL_COMMUNITY)
Admission: EM | Admit: 2014-05-08 | Discharge: 2014-05-08 | Disposition: A | Discharge status: Home / Self Care | Payer: Medicaid Other | Attending: Emergency Medicine | Admitting: Emergency Medicine

## 2014-05-08 DIAGNOSIS — Z872 Personal history of diseases of the skin and subcutaneous tissue: Secondary | ICD-10-CM | POA: Insufficient documentation

## 2014-05-08 DIAGNOSIS — J029 Acute pharyngitis, unspecified: Secondary | ICD-10-CM

## 2014-05-08 DIAGNOSIS — H6120 Impacted cerumen, unspecified ear: Secondary | ICD-10-CM | POA: Diagnosis not present

## 2014-05-08 DIAGNOSIS — H66009 Acute suppurative otitis media without spontaneous rupture of ear drum, unspecified ear: Secondary | ICD-10-CM | POA: Diagnosis not present

## 2014-05-08 DIAGNOSIS — H6123 Impacted cerumen, bilateral: Secondary | ICD-10-CM

## 2014-05-08 DIAGNOSIS — H9209 Otalgia, unspecified ear: Secondary | ICD-10-CM | POA: Diagnosis present

## 2014-05-08 DIAGNOSIS — H66002 Acute suppurative otitis media without spontaneous rupture of ear drum, left ear: Secondary | ICD-10-CM

## 2014-05-08 MED ORDER — OXYCODONE-ACETAMINOPHEN 5-325 MG PO TABS
2.0000 | ORAL_TABLET | Freq: Once | ORAL | Status: AC
  Administered 2014-05-08: 2 via ORAL
  Filled 2014-05-08: qty 2

## 2014-05-08 MED ORDER — TRAMADOL HCL 50 MG PO TABS: 50.0000 mg | ORAL_TABLET | Freq: Four times a day (QID) | ORAL | Status: DC | PRN

## 2014-05-08 MED ORDER — AMOXICILLIN 500 MG PO CAPS: 500.0000 mg | ORAL_CAPSULE | Freq: Three times a day (TID) | ORAL | Status: DC

## 2014-05-08 MED ORDER — DOCUSATE SODIUM 50 MG/5ML PO LIQD
100.0000 mg | Freq: Once | ORAL | Status: AC
  Administered 2014-05-08: 100 mg via ORAL
  Filled 2014-05-08: qty 10

## 2014-05-08 MED ORDER — ONDANSETRON 4 MG PO TBDP
4.0000 mg | ORAL_TABLET | Freq: Once | ORAL | Status: AC
  Administered 2014-05-08: 4 mg via ORAL
  Filled 2014-05-08: qty 1

## 2014-05-08 NOTE — ED Provider Notes (Signed)
CSN: [id number]     Arrival date & time 05/08/14  1237 History   This chart was scribed for a non-physician practitioner, Arthor Captain, PA-C working with Glynn Octave, MD by [place] Peace, ED Scribe. The patient was seen in [facility]06. The patient's care was started at 2:13 PM.     Chief Complaint  Patient presents with  . Otalgia  . Sore Throat      The history is provided by the patient. No language interpreter was used.   HPI Comments: Tamara Foster is a 23 y.o. female who presents to the Emergency Department complaining of sore throat onset Wednesday night with associated otalgia of left ear. She reports some pain while swallowing but states she is able to swallow. Pt denies fever or history of strep throat. Pt has tried Ibuprofen without relief. Pt is non-smoker.    Past Medical History  Diagnosis Date  . Eczema    History reviewed. No pertinent past surgical history. Family History  Problem Relation Age of Onset  . Cancer Father     bone  . Diabetes Father   . Cancer Paternal Aunt     breast   History  Substance Use Topics  . Smoking status: Never Smoker   . Smokeless tobacco: Not on file  . Alcohol Use: No   OB History   Grav Para Term Preterm Abortions TAB SAB Ect Mult Living   Review of Systems  Constitutional: Negative for fever.  HENT: Positive for ear pain (left) and sore throat. Negative for trouble swallowing.        Positive pain with swallowing.       Allergies  Review of patient's allergies indicates no known allergies.  Home Medications   Prior to Admission medications   Medication Sig Start Date End Date Taking? Authorizing Provider  MedroxyPROGESTERone Acetate (DEPO-PROVERA IM) Inject into the muscle every 3 (three) months.    Historical Provider, MD  traMADol (ULTRAM) 50 MG tablet Take 1 tablet (50 mg total) by mouth every 6 (six) hours as needed for pain. 05/01/13   Linna Hoff, MD   BP 127/80  Pulse 98  Temp(Src) 98.8  F (37.1 C) (Oral)  Resp 15  Ht  (1.676 m)  Wt 148 lb 5 oz (67.274 kg)  BMI 23.95 kg/m2  SpO2 97%  Breastfeeding? No Physical Exam  Nursing note and vitals reviewed. Constitutional: She is oriented to person, place, and time. She appears well-developed and well-nourished. No distress.  HENT:  Head: Normocephalic and atraumatic.  Mouth/Throat: No oropharyngeal exudate.  Pharyngeal erythema, no exudates. Bilateral cerumen impaction, not able to see TM's clearly.   Eyes: Conjunctivae and EOM are normal.  Neck: Normal range of motion. Neck supple. No tracheal deviation present.  Cardiovascular: Normal rate.   Pulmonary/Chest: Effort normal. No respiratory distress.  Musculoskeletal: Normal range of motion.  Lymphadenopathy:    She has cervical adenopathy (moderate).  Neurological: She is alert and oriented to person, place, and time.  Skin: Skin is warm and dry.  Psychiatric: She has a normal mood and affect. Her behavior is normal.    ED Course  Procedures (including critical care time) Labs Review Labs Reviewed - No data to display  Results for orders placed during the hospital encounter of 04/28/13  CBC WITH DIFFERENTIAL      Result Value Ref Range   WBC 5.9  4.0 - 10.5 K/uL  RBC 4.60  3.87 - 5.11 MIL/uL   Hemoglobin 12.2  12.0 - 15.0 g/dL   HCT 16.1  09.6 - 04.5 %   MCV 78.3  78.0 - 100.0 fL   MCH 26.5  26.0 - 34.0 pg   MCHC 33.9  30.0 - 36.0 g/dL   RDW 40.9  81.1 - 91.4 %   Platelets 182  150 - 400 K/uL   Neutrophils Relative % 49  43 - 77 %   Neutro Abs 2.9  1.7 - 7.7 K/uL   Lymphocytes Relative 43  12 - 46 %   Lymphs Abs 2.5  0.7 - 4.0 K/uL   Monocytes Relative 7  3 - 12 %   Monocytes Absolute 0.4  0.1 - 1.0 K/uL   Eosinophils Relative 0  0 - 5 %   Eosinophils Absolute 0.0  0.0 - 0.7 K/uL   Basophils Relative 0  0 - 1 %   Basophils Absolute 0.0  0.0 - 0.1 K/uL  BASIC METABOLIC PANEL      Result Value Ref Range   Sodium 140  135 - 145 mEq/L   Potassium  3.4 (*) 3.5 - 5.1 mEq/L   Chloride 106  96 - 112 mEq/L   CO2 22  19 - 32 mEq/L   Glucose, Bld 85  70 - 99 mg/dL   BUN 14  6 - 23 mg/dL   Creatinine, Ser 7.82  0.50 - 1.10 mg/dL   Calcium 9.5  8.4 - 95.6 mg/dL   GFR calc non Af Amer >90  >90 mL/min   GFR calc Af Amer >90  >90 mL/min   No results found.   Ceruminosis is noted.  Wax is removed by syringing and manual debridement. Instructions for home care to prevent wax buildup are given.   Imaging Review No results found.   EKG Interpretation None     Medications - No data to display  2:17 PM- Treatment plan was discussed with patient who verbalizes understanding and agrees.   MDM   Final diagnoses:  Acute suppurative otitis media of left ear without spontaneous rupture of tympanic membrane, recurrence not specified  Cerumen impaction, bilateral  Pharyngitis    Patient presents with otalgia and exam consistent with acute otitis media. No concern for acute mastoiditis, meningitis.   Patient discharged home with Amoxicillin. I have also discussed reasons to return immediately to the ER.  Parent expresses understanding and agrees with plan.      I personally performed the services described in this documentation, which was scribed in my presence. The recorded information has been reviewed and is accurate.     Arthor Captain, PA-C 05/12/14 [phone]

## 2014-05-08 NOTE — ED Notes (Signed)
Pt c/o sore throat onset Wednesday night and left ear pain onset last night. Pt tried ibuprofen without relief.

## 2014-05-08 NOTE — Discharge Instructions (Signed)
Otitis Media Otitis media is redness, soreness, and inflammation of the middle ear. Otitis media may be caused by allergies or, most commonly, by infection. Often it occurs as a complication of the common cold. SIGNS AND SYMPTOMS Symptoms of otitis media may include:  Earache.  Fever.  Ringing in your ear.  Headache.  Leakage of fluid from the ear. DIAGNOSIS To diagnose otitis media, your health care provider will examine your ear with an otoscope. This is an instrument that allows your health care provider to see into your ear in order to examine your eardrum. Your health care provider also will ask you questions about your symptoms. TREATMENT  Typically, otitis media resolves on its own within 3-5 days. Your health care provider may prescribe medicine to ease your symptoms of pain. If otitis media does not resolve within 5 days or is recurrent, your health care provider may prescribe antibiotic medicines if he or she suspects that a bacterial infection is the cause. HOME CARE INSTRUCTIONS   If you were prescribed an antibiotic medicine, finish it all even if you start to feel better.  Take medicines only as directed by your health care provider.  Keep all follow-up visits as directed by your health care provider. SEEK MEDICAL CARE IF:  You have otitis media only in one ear, or bleeding from your nose, or both.  You notice a lump on your neck.  You are not getting better in 3-5 days.  You feel worse instead of better. SEEK IMMEDIATE MEDICAL CARE IF:   You have pain that is not controlled with medicine.  You have swelling, redness, or pain around your ear or stiffness in your neck.  You notice that part of your face is paralyzed.  You notice that the bone behind your ear (mastoid) is tender when you touch it. MAKE SURE YOU:   Understand these instructions.  Will watch your condition.  Will get help right away if you are not doing well or get worse. Document Released:  05/18/2004 Document Revised: 12/28/2013 Document Reviewed: 03/10/2013 [facility] Patient Information 2015 [place], [place]. This information is not intended to replace advice given to you by your health care provider. Make sure you discuss any questions you have with your health care provider.  Cerumen Impaction A cerumen impaction is when the wax in your ear forms a plug. This plug usually causes reduced hearing. Sometimes it also causes an earache or dizziness. Removing a cerumen impaction can be difficult and painful. The wax sticks to the ear canal. The canal is sensitive and bleeds easily. If you try to remove a heavy wax buildup with a cotton tipped swab, you may push it in further. Irrigation with water, suction, and small ear curettes may be used to clear out the wax. If the impaction is fixed to the skin in the ear canal, ear drops may be needed for a few days to loosen the wax. People who build up a lot of wax frequently can use ear wax removal products available in your local drugstore. SEEK MEDICAL CARE IF:  You develop an earache, increased hearing loss, or marked dizziness. Document Released: 09/20/2004 Document Revised: 11/05/2011 Document Reviewed: 11/10/2009 [facility] [place] Patient Information 2015 [place], [place]. This information is not intended to replace advice given to you by your health care provider. Make sure you discuss any questions you have with your health care provider.  Pharyngitis Pharyngitis is redness, pain, and swelling (inflammation) of your pharynx.  CAUSES  Pharyngitis is usually caused by  infection. Most of the time, these infections are from viruses (viral) and are part of a cold. However, sometimes pharyngitis is caused by bacteria (bacterial). Pharyngitis can also be caused by allergies. Viral pharyngitis may be spread from person to person by coughing, sneezing, and personal items or utensils (cups, forks, spoons, toothbrushes). Bacterial pharyngitis may be spread from  person to person by more intimate contact, such as kissing.  SIGNS AND SYMPTOMS  Symptoms of pharyngitis include:   Sore throat.   Tiredness (fatigue).   Low-grade fever.   Headache.  Joint pain and muscle aches.  Skin rashes.  Swollen lymph nodes.  Plaque-like film on throat or tonsils (often seen with bacterial pharyngitis). DIAGNOSIS  Your health care provider will ask you questions about your illness and your symptoms. Your medical history, along with a physical exam, is often all that is needed to diagnose pharyngitis. Sometimes, a rapid strep test is done. Other lab tests may also be done, depending on the suspected cause.  TREATMENT  Viral pharyngitis will usually get better in 3-4 days without the use of medicine. Bacterial pharyngitis is treated with medicines that kill germs (antibiotics).  HOME CARE INSTRUCTIONS   Drink enough water and fluids to keep your urine clear or pale yellow.   Only take over-the-counter or prescription medicines as directed by your health care provider:   If you are prescribed antibiotics, make sure you finish them even if you start to feel better.   Do not take aspirin.   Get lots of rest.   Gargle with 8 oz of salt water ( tsp of salt per 1 qt of water) as often as every 1-2 hours to soothe your throat.   Throat lozenges (if you are not at risk for choking) or sprays may be used to soothe your throat. SEEK MEDICAL CARE IF:   You have large, tender lumps in your neck.  You have a rash.  You cough up green, yellow-brown, or bloody spit. SEEK IMMEDIATE MEDICAL CARE IF:   Your neck becomes stiff.  You drool or are unable to swallow liquids.  You vomit or are unable to keep medicines or liquids down.  You have severe pain that does not go away with the use of recommended medicines.  You have trouble breathing (not caused by a stuffy nose). MAKE SURE YOU:   Understand these instructions.  Will watch your  condition.  Will get help right away if you are not doing well or get worse. Document Released: 08/13/2005 Document Revised: 06/03/2013 Document Reviewed: 04/20/2013 [facility] Patient Information 2015 [place], [place]. This information is not intended to replace advice given to you by your health care provider. Make sure you discuss any questions you have with your health care provider.

## 2014-05-11 ENCOUNTER — Emergency Department (HOSPITAL_COMMUNITY)
Admission: EM | Admit: 2014-05-11 | Discharge: 2014-05-11 | Disposition: A | Discharge status: Home / Self Care | Payer: Medicaid Other | Source: Home / Self Care | Attending: Family Medicine | Admitting: Family Medicine

## 2014-05-11 ENCOUNTER — Encounter (HOSPITAL_COMMUNITY): Payer: Self-pay | Admitting: Emergency Medicine

## 2014-05-11 DIAGNOSIS — H60399 Other infective otitis externa, unspecified ear: Secondary | ICD-10-CM

## 2014-05-11 DIAGNOSIS — H6120 Impacted cerumen, unspecified ear: Secondary | ICD-10-CM

## 2014-05-11 DIAGNOSIS — H6122 Impacted cerumen, left ear: Secondary | ICD-10-CM

## 2014-05-11 DIAGNOSIS — H6092 Unspecified otitis externa, left ear: Secondary | ICD-10-CM

## 2014-05-11 MED ORDER — IBUPROFEN 600 MG PO TABS: 600.0000 mg | ORAL_TABLET | Freq: Once | ORAL | Status: DC

## 2014-05-11 MED ORDER — CIPROFLOXACIN-DEXAMETHASONE 0.3-0.1 % OT SUSP: 4.0000 [drp] | Freq: Two times a day (BID) | OTIC | Status: DC

## 2014-05-11 MED ORDER — ANTIPYRINE-BENZOCAINE 5.4-1.4 % OT SOLN
3.0000 [drp] | Freq: Once | OTIC | Status: DC
Start: 2014-05-11 — End: 2015-05-09

## 2014-05-11 MED ORDER — ANTIPYRINE-BENZOCAINE 5.4-1.4 % OT SOLN: 3.0000 [drp] | Freq: Once | OTIC | Status: DC

## 2014-05-11 MED ORDER — IBUPROFEN 800 MG PO TABS
800.0000 mg | ORAL_TABLET | Freq: Once | ORAL | Status: AC
  Administered 2014-05-11: 800 mg via ORAL

## 2014-05-11 MED ORDER — IBUPROFEN 800 MG PO TABS
ORAL_TABLET | ORAL | Status: AC
  Filled 2014-05-11: qty 1

## 2014-05-11 NOTE — ED Notes (Signed)
Pt      Reports  She  Was  Seen  In the  Er      3  Days  Ago  For  An  Ear  Infection    She reports  She  Got  The  meds  Filled  And  Reports      That    She now  Has  Some  Decreased   In hearing           She  Is  Sitting upright on  The  Exam table  Speaking in   Complete  sentances and  Is  In no acute  Distress

## 2014-05-11 NOTE — ED Provider Notes (Signed)
CSN: [id number]     Arrival date & time 05/11/14  1525 History   First MD Initiated Contact with Patient 05/11/14 1704     Chief Complaint  Patient presents with  . Otalgia   (Consider location/radiation/quality/duration/timing/severity/associated sxs/prior Treatment) HPI  Dx w/ AOM on 9/12. Started on amoxicillin and tramadol. Developed inability to hear out of L ear on 9/13. Has taken medication as prescribed. Has not tried to clean ears. Denies fevers, sore throat, dizziness, syncope, nausea, HA, vomiting, rash. Pt reports having ears cleaned out at ED on 9/12.    Past Medical History  Diagnosis Date  . Eczema    History reviewed. No pertinent past surgical history. Family History  Problem Relation Age of Onset  . Cancer Father     bone  . Diabetes Father   . Cancer Paternal Aunt     breast   History  Substance Use Topics  . Smoking status: Never Smoker   . Smokeless tobacco: Not on file  . Alcohol Use: No   OB History   Grav Para Term Preterm Abortions TAB SAB Ect Mult Living   Review of Systems Per HPI with all other pertinent systems negative.   Allergies  Review of patient's allergies indicates no known allergies.  Home Medications   Prior to Admission medications   Medication Sig Start Date End Date Taking? Authorizing Provider  amoxicillin (AMOXIL) 500 MG capsule Take 1 capsule (500 mg total) by mouth 3 (three) times daily. 05/08/14   Arthor Captain, PA-C  antipyrine-benzocaine [facility]) otic solution Place 3-4 drops into both ears once. 05/11/14   Ozella Rocks, MD  ciprofloxacin-dexamethasone [facility]) otic suspension Place 4 drops into the left ear 2 (two) times daily. 05/11/14   Ozella Rocks, MD  MedroxyPROGESTERone Acetate (DEPO-PROVERA IM) Inject into the muscle every 3 (three) months.    Historical Provider, MD  traMADol (ULTRAM) 50 MG tablet Take 1 tablet (50 mg total) by mouth every 6 (six) hours as needed. 05/08/14   Abigail  Harris, PA-C   BP 135/78  Pulse 91  Temp(Src) 99 F (37.2 C) (Oral)  Resp 16  SpO2 97% Physical Exam  Constitutional: She is oriented to person, place, and time. She appears well-developed and well-nourished. No distress.  HENT:  Head: Normocephalic.  R TM w/ small AVM on TM vs hemorrhage L TM completely obscured by wax L TM after cleaning completely devoid of wax but external canal very erythematous and areas of minor bleeding. No edema or pururlence  Eyes: EOM are normal.  Neck: Normal range of motion.  Cardiovascular: Normal rate, normal heart sounds and intact distal pulses.   No murmur heard. Pulmonary/Chest: Effort normal and breath sounds normal. No respiratory distress.  Abdominal: Soft.  Musculoskeletal: Normal range of motion. She exhibits no edema.  Neurological: She is alert and oriented to person, place, and time.  Skin: Skin is warm and dry. She is not diaphoretic. No erythema.  Psychiatric: She has a normal mood and affect. Her behavior is normal. Judgment and thought content normal.    ED Course  Procedures (including critical care time) Labs Review Labs Reviewed - No data to display  Imaging Review No results found.   MDM   1. Cerumen impaction, left   2. Otitis externa, left    Ears disimpacted by staff TMs w/ injection bilat, likely from cleaning No signe of AOM Continue AMox due  to benefit in overall sore throat and other symptoms AUralgan PRN pain Ciprodex Rx given but pt not to use unless pain in ears persists or develops drainage  Precautions given and all questions answered  Shelly Flatten, MD Family Medicine 05/11/2014, 5:57 PM      Ozella Rocks, MD 05/11/14 1757

## 2014-05-11 NOTE — Discharge Instructions (Signed)
Your left ear was completely blocked by wax THis was cleaned in our clinic Start the auralgan every 2-4 hours as needed for pain Please start ibuprofen  every 6 hours There isn't any overt sign of infection but if your pain gets worse then start the antbiotic drops

## 2014-05-12 ENCOUNTER — Telehealth (HOSPITAL_COMMUNITY): Payer: Self-pay | Admitting: *Deleted

## 2014-05-12 NOTE — ED Notes (Signed)
[facility] pharmacy called twice last night and once today on VM for directions on Auralgan.  They said it says take once.  Discussed with Dr. Konrad Dolores and he said it should read 3-4 drops both ears every 2 hrs as needed for pain. I called [phone] and gave the directions to the pharmacy staff. [name] 05/12/2014

## 2014-05-14 NOTE — ED Provider Notes (Signed)
Medical screening examination/treatment/procedure(s) were performed by non-physician practitioner and as supervising physician I was immediately available for consultation/collaboration.   EKG Interpretation None        Eudora Guevarra David Tiwana Chavis III, MD 05/14/14 1527 

## 2014-05-17 ENCOUNTER — Telehealth: Payer: Self-pay | Admitting: Internal Medicine

## 2014-05-18 ENCOUNTER — Ambulatory Visit: Payer: Medicaid Other | Admitting: Internal Medicine

## 2014-06-28 ENCOUNTER — Encounter (HOSPITAL_COMMUNITY): Payer: Self-pay | Admitting: Emergency Medicine

## 2015-05-09 ENCOUNTER — Emergency Department (HOSPITAL_COMMUNITY)
Admission: EM | Admit: 2015-05-09 | Discharge: 2015-05-09 | Disposition: A | Discharge status: Home / Self Care | Payer: Medicaid Other | Source: Home / Self Care | Attending: Family Medicine | Admitting: Family Medicine

## 2015-05-09 ENCOUNTER — Encounter (HOSPITAL_COMMUNITY): Payer: Self-pay | Admitting: Emergency Medicine

## 2015-05-09 DIAGNOSIS — A084 Viral intestinal infection, unspecified: Secondary | ICD-10-CM

## 2015-05-09 MED ORDER — GI COCKTAIL ~~LOC~~
30.0000 mL | Freq: Once | ORAL | Status: AC
  Administered 2015-05-09: 30 mL via ORAL

## 2015-05-09 MED ORDER — ONDANSETRON 8 MG PO TBDP: 4.0000 mg | ORAL_TABLET | Freq: Once | ORAL | Status: DC

## 2015-05-09 MED ORDER — ONDANSETRON 4 MG PO TBDP
8.0000 mg | ORAL_TABLET | Freq: Once | ORAL | Status: AC
  Administered 2015-05-09: 8 mg via ORAL

## 2015-05-09 MED ORDER — GI COCKTAIL ~~LOC~~
ORAL | Status: AC
  Filled 2015-05-09: qty 30

## 2015-05-09 MED ORDER — ONDANSETRON 4 MG PO TBDP
ORAL_TABLET | ORAL | Status: AC
  Filled 2015-05-09: qty 2

## 2015-05-09 NOTE — ED Notes (Signed)
Pt has been vomiting since 9:00 this morning.  She is here with her 24 year old daughter who has been vomiting since last night.  Pt complains of abdominal pain and mid chest pain when vomiting.

## 2015-05-09 NOTE — Discharge Instructions (Signed)
You've developed a condition called viral gastroenteritis. This is likely to last another 1-3 days, but may last as's 5 days or longer. Please try still well-hydrated with things such as Gatorade or further electrolyte rich drinks. Please start a bland diet with things such as saltine crackers and mild soups until you're appetite returns. Please consider starting a probiotic.

## 2015-05-09 NOTE — ED Provider Notes (Signed)
CSN: [id number]     Arrival date & time 05/09/15  1605 History   First MD Initiated Contact with Patient 05/09/15 1806     Chief Complaint  Patient presents with  . Emesis   (Consider location/radiation/quality/duration/timing/severity/associated sxs/prior Treatment) HPI  Nausea and vomiting. Started today. 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 Denies any dysuria, frequency, back pain, rash, fevers. Symptoms are associated with this some burning sensation in her chest that goes up towards her neck. Nothing makes her symptoms better. Nothing which her symptoms worse. Unable to keep any food down and states that every time she tries to eat she develops emesis. She's had 2 bouts of nonbloody diarrhea today. At this point time of her emesis is primarily bilious and nonbloody.  Past Medical History  Diagnosis Date  . Eczema    History reviewed. No pertinent past surgical history. Family History  Problem Relation Age of Onset  . Cancer Father     bone  . Diabetes Father   . Cancer Paternal Aunt     breast   Social History  Substance Use Topics  . Smoking status: Never Smoker   . Smokeless tobacco: None  . Alcohol Use: No   OB History    Gravida Para Term Preterm AB TAB SAB Ectopic Multiple Living   Review of Systems Per HPI with all other pertinent systems negative.   Allergies  Review of patient's allergies indicates no known allergies.  Home Medications   Prior to Admission medications   Medication Sig Start Date End Date Taking? Authorizing Provider  MedroxyPROGESTERone Acetate (DEPO-PROVERA IM) Inject into the muscle every 3 (three) months.    Historical Provider, MD  ondansetron (ZOFRAN-ODT) 8 MG disintegrating tablet Take 0.5-1 tablets (4-8 mg total) by mouth once. 05/09/15   Ozella Rocks, MD   Meds Ordered and Administered this Visit   Medications   ondansetron (ZOFRAN-ODT) disintegrating tablet 8 mg (8 mg Oral Given 05/09/15 1809)  gi cocktail (Maalox,Lidocaine,Donnatal) (30 mLs Oral Given 05/09/15 1820)    BP 123/76 mmHg  Pulse 116  Temp(Src) 98.6 F (37 C) (Oral)  Resp 16  SpO2 98% No data found.   Physical Exam Physical Exam  Constitutional: oriented to person, place, and time. appears well-developed and well-nourished. No distress.  HENT:  Head: Normocephalic and atraumatic.  Eyes: EOMI. PERRL.  Neck: Normal range of motion.  Cardiovascular: RRR, no m/r/g, 2+ distal pulses,  Pulmonary/Chest: Effort normal and breath sounds normal. No respiratory distress.  Abdominal: Soft. Bowel sounds are normal. NonTTP, no distension.  Musculoskeletal: Normal range of motion. Non ttp, no effusion.  Neurological: alert and oriented to person, place, and time.  Skin: Skin is warm. No rash noted. non diaphoretic.  Psychiatric: normal mood and affect. behavior is normal. Judgment and thought content normal.   ED Course  Procedures (including critical care time)  Labs Review Labs Reviewed - No data to display  Imaging Review No results found.   Visual Acuity Review  Right Eye Distance:   Left Eye Distance:   Bilateral Distance:    Right Eye Near:   Left Eye Near:    Bilateral Near:         MDM   1. Viral gastroenteritis    8 mg of Zofran by mouth administered. Patient to continue with Zofran at home. Fluids,  rest, electrolytes, bland diet.     Ozella Rocks, MD 05/09/15 [phone]

## 2016-02-08 ENCOUNTER — Encounter (HOSPITAL_COMMUNITY): Payer: Self-pay | Admitting: Nurse Practitioner

## 2016-02-08 ENCOUNTER — Emergency Department (HOSPITAL_COMMUNITY)
Admission: EM | Admit: 2016-02-08 | Discharge: 2016-02-08 | Disposition: A | Discharge status: Home / Self Care | Payer: Medicaid Other | Attending: Emergency Medicine | Admitting: Emergency Medicine

## 2016-02-08 DIAGNOSIS — R2243 Localized swelling, mass and lump, lower limb, bilateral: Secondary | ICD-10-CM | POA: Insufficient documentation

## 2016-02-08 DIAGNOSIS — M79605 Pain in left leg: Secondary | ICD-10-CM | POA: Diagnosis present

## 2016-02-08 DIAGNOSIS — R600 Localized edema: Secondary | ICD-10-CM

## 2016-02-08 DIAGNOSIS — M79604 Pain in right leg: Secondary | ICD-10-CM | POA: Diagnosis not present

## 2016-02-08 LAB — I-STAT CHEM 8, ED
BUN: 15 mg/dL (ref 6–20)
Calcium, Ion: 1.16 mmol/L (ref 1.12–1.23)
Chloride: 103 mmol/L (ref 101–111)
Creatinine, Ser: 0.5 mg/dL (ref 0.44–1.00)
Glucose, Bld: 76 mg/dL (ref 65–99)
HCT: 43 % (ref 36.0–46.0)
Hemoglobin: 14.6 g/dL (ref 12.0–15.0)
Potassium: 3.7 mmol/L (ref 3.5–5.1)
Sodium: 142 mmol/L (ref 135–145)
TCO2: 24 mmol/L (ref 0–100)

## 2016-02-08 LAB — URINALYSIS, ROUTINE W REFLEX MICROSCOPIC
Bilirubin Urine: NEGATIVE
Glucose, UA: NEGATIVE mg/dL
Hgb urine dipstick: NEGATIVE
Ketones, ur: NEGATIVE mg/dL
Nitrite: NEGATIVE
Protein, ur: NEGATIVE mg/dL
Specific Gravity, Urine: 1.028 (ref 1.005–1.030)
pH: 6.5 (ref 5.0–8.0)

## 2016-02-08 LAB — PREGNANCY, URINE: Preg Test, Ur: NEGATIVE

## 2016-02-08 LAB — URINE MICROSCOPIC-ADD ON

## 2016-02-08 NOTE — Discharge Instructions (Signed)
Keep legs elevated at rest, and use compression stockings. Your kidney function is normal and you are not pregnant. Decrease salt intake as well to see if symptoms improve. Follow-up with your primary care doctor for further management. Return for worsening symptoms, including difficulty breathing, chest pain, or any other symptoms concerning to you.  Peripheral Edema You have swelling in your legs (peripheral edema). This swelling is due to excess accumulation of salt and water in your body. Edema may be a sign of heart, kidney or liver disease, or a side effect of a medication. It may also be due to problems in the leg veins. Elevating your legs and using special support stockings may be very helpful, if the cause of the swelling is due to poor venous circulation. Avoid long periods of standing, whatever the cause. Treatment of edema depends on identifying the cause. Chips, pretzels, pickles and other salty foods should be avoided. Restricting salt in your diet is almost always needed. Water pills (diuretics) are often used to remove the excess salt and water from your body via urine. These medicines prevent the kidney from reabsorbing sodium. This increases urine flow. Diuretic treatment may also result in lowering of potassium levels in your body. Potassium supplements may be needed if you have to use diuretics daily. Daily weights can help you keep track of your progress in clearing your edema. You should call your caregiver for follow up care as recommended. SEEK IMMEDIATE MEDICAL CARE IF:   You have increased swelling, pain, redness, or heat in your legs.  You develop shortness of breath, especially when lying down.  You develop chest or abdominal pain, weakness, or fainting.  You have a fever.   This information is not intended to replace advice given to you by your health care provider. Make sure you discuss any questions you have with your health care provider.   Document Released:  09/20/2004 Document Revised: 11/05/2011 Document Reviewed: 02/23/2015 Elsevier Interactive Patient Education [employer]2016 [employer] Inc.

## 2016-02-08 NOTE — ED Provider Notes (Signed)
CSN: [id number]     Arrival date & time 02/08/16  1317 History   First MD Initiated Contact with Patient 02/08/16 1521     Chief Complaint  Patient presents with  . Leg Pain     (Consider location/radiation/quality/duration/timing/severity/associated sxs/prior Treatment) HPI 25 year old female who presents with bilateral lower extremity swelling, intermittent for the past 5 days. 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 Symptoms resolved whenever she lifts up her legs against gravity. Has had some soreness in her lower extremities. No shortness of breath, dyspnea on exertion, fatigue, orthopnea, PND, chest pain. Has had normal urine output, no abdominal distention, no abdominal pain.  Past Medical History  Diagnosis Date  . Eczema    History reviewed. No pertinent past surgical history. Family History  Problem Relation Age of Onset  . Cancer Father     bone  . Diabetes Father   . Cancer Paternal Aunt     breast   Social History  Substance Use Topics  . Smoking status: Never Smoker   . Smokeless tobacco: None  . Alcohol Use: No   OB History    Gravida Para Term Preterm AB TAB SAB Ectopic Multiple Living   Review of Systems 10/14 systems reviewed and are negative other than those stated in the HPI    Allergies  Review of patient's allergies indicates no known allergies.  Home Medications   Prior to Admission medications   Medication Sig Start Date End Date Taking? Authorizing Provider  ondansetron (ZOFRAN-ODT) 8 MG disintegrating tablet Take 0.5-1 tablets (4-8 mg total) by mouth once. Patient not taking: Reported on 02/08/2016 05/09/15   Ozella Rocks, MD   BP 120/69 mmHg  Pulse 100  Temp(Src) 98.3 F (36.8 C) (Oral)  Resp 18  SpO2 99% Physical Exam Physical Exam  Nursing note and vitals reviewed. Constitutional:  Well developed, well nourished, non-toxic, and in no acute distress Head: Normocephalic and atraumatic.  Mouth/Throat: Oropharynx is clear and moist.  Neck: Normal range of motion. Neck supple.  Cardiovascular: Normal rate and regular rhythm.   Pulmonary/Chest: Effort normal and breath sounds normal.  Abdominal: Soft. There is no tenderness. There is no rebound and no guarding.  Musculoskeletal: Normal range of motion. No lower extremity edema. Neurological: Alert, no facial droop, fluent speech, moves all extremities symmetrically Skin: Skin is warm and dry.  Psychiatric: Cooperative  ED Course  Procedures (including critical care time) Labs Review Labs Reviewed  PREGNANCY, URINE  URINALYSIS, ROUTINE W REFLEX MICROSCOPIC (NOT AT [facility])  I-STAT CHEM 8, ED    Imaging Review No results found. I have personally reviewed and evaluated these images and lab results as part of my medical decision-making.   EKG Interpretation None      MDM   Final diagnoses:  Bilateral lower extremity edema   With 5 days of intermittent bilateral leg pain, leg swelling. Well appearing and in no acute distress. Vital signs non-concerning. No edema currently on exam. Suspect likely dependent edema. Low risk for DVT and bilateral nature of symptoms makes this unlikely. No heart failure symptoms or risk factors. Presentation not suggestive of renal or liver dysfunction. Normal renal function and no electrolyte derangements. Urine pregnancy negative. Suspect dependent edema and recommending compression stockings, elevation of legs at rest. Strict return  and follow-up instructions reviewed. She expressed understanding of all discharge instructions and felt comfortable with the plan of care.      Lavera Guiseana Duo Georg Ang, MD 02/08/16 [phone]

## 2016-02-08 NOTE — ED Notes (Signed)
Went out to waiting area to attempt to find pt, spoke with nurse first and NT first.  No answer when called

## 2016-02-08 NOTE — ED Notes (Signed)
Called X2 to bring back to room.

## 2016-02-08 NOTE — ED Notes (Signed)
Pt c/o 5 day history of BLE swelling, pain and tingling from knees to feet. Elevation relieves the symptoms. Standing for long periods increases the symptoms. No edema is noted. Denies cp, sob. She is alert and breathing easily

## 2016-03-11 ENCOUNTER — Ambulatory Visit (HOSPITAL_COMMUNITY)
Admission: EM | Admit: 2016-03-11 | Discharge: 2016-03-11 | Disposition: A | Discharge status: Home / Self Care | Payer: Medicaid Other | Attending: Emergency Medicine | Admitting: Emergency Medicine

## 2016-03-11 ENCOUNTER — Encounter (HOSPITAL_COMMUNITY): Payer: Self-pay | Admitting: Emergency Medicine

## 2016-03-11 DIAGNOSIS — Z79899 Other long term (current) drug therapy: Secondary | ICD-10-CM | POA: Diagnosis not present

## 2016-03-11 DIAGNOSIS — R300 Dysuria: Secondary | ICD-10-CM | POA: Diagnosis present

## 2016-03-11 DIAGNOSIS — N949 Unspecified condition associated with female genital organs and menstrual cycle: Secondary | ICD-10-CM

## 2016-03-11 DIAGNOSIS — N390 Urinary tract infection, site not specified: Secondary | ICD-10-CM

## 2016-03-11 LAB — POCT URINALYSIS DIP (DEVICE)
Glucose, UA: 250 mg/dL — AB
Nitrite: POSITIVE — AB
Protein, ur: 100 mg/dL — AB
Specific Gravity, Urine: 1.015 (ref 1.005–1.030)
Urobilinogen, UA: 2 mg/dL — ABNORMAL HIGH (ref 0.0–1.0)
pH: 6 (ref 5.0–8.0)

## 2016-03-11 LAB — POCT PREGNANCY, URINE: Preg Test, Ur: NEGATIVE

## 2016-03-11 MED ORDER — CEPHALEXIN 500 MG PO CAPS: 500.0000 mg | ORAL_CAPSULE | Freq: Four times a day (QID) | ORAL | Status: DC

## 2016-03-11 MED ORDER — VALACYCLOVIR HCL 1 G PO TABS: 1000.0000 mg | ORAL_TABLET | Freq: Two times a day (BID) | ORAL | Status: AC

## 2016-03-11 NOTE — Discharge Instructions (Signed)
It looks like you have a urinary tract infection. I have sent your urine for culture. Take Keflex 4 times a day for 5 days.  The bumps are concerning for herpes. We collected a swab to test for that today. Please take Valtrex twice a day while we wait for the culture to come back.  We also collected swabs for gonorrhea and chlamydia. We will call you with these results in 2-3 days.

## 2016-03-11 NOTE — ED Provider Notes (Signed)
CSN: [id number]     Arrival date & time 03/11/16  1727 History   First MD Initiated Contact with Patient 03/11/16 1757     Chief Complaint  Patient presents with  . Vaginal Itching   (Consider location/radiation/quality/duration/timing/severity/associated sxs/prior Treatment) HPI  She is a 25 year old woman here for evaluation of vaginal discomfort and dysuria. She states she has had some vaginal irritation for the last 3 days. She is maybe had a little bit of discharge. Yesterday, she noted several painful bumps primarily on the left labia. She is also had dysuria for the last 2-3 days. No fevers or chills. She does report a new sexual partner on July 11. They did use condoms. Her last menstrual period was over a year ago. She stopped the depo shot a year ago.  Past Medical History  Diagnosis Date  . Eczema    History reviewed. No pertinent past surgical history. Family History  Problem Relation Age of Onset  . Cancer Father     bone  . Diabetes Father   . Cancer Paternal Aunt     breast   Social History  Substance Use Topics  . Smoking status: Never Smoker   . Smokeless tobacco: None  . Alcohol Use: No   OB History    Gravida Para Term Preterm AB TAB SAB Ectopic Multiple Living   1 1 1       1      Review of Systems As in history of present illness Allergies  Review of patient's allergies indicates no known allergies.  Home Medications   Prior to Admission medications   Medication Sig Start Date End Date Taking? Authorizing Provider  cephALEXin (KEFLEX) 500 MG capsule Take 1 capsule (500 mg total) by mouth 4 (four) times daily. 03/11/16   Charm RingsErin J Honig, MD  valACYclovir (VALTREX) 1000 MG tablet Take 1 tablet (1,000 mg total) by mouth 2 (two) times daily. 03/11/16 03/25/16  Charm RingsErin J Honig, MD   Meds Ordered and Administered this Visit  Medications - No data to display  There were no vitals taken for this visit. No data found.   Physical Exam  Constitutional: She is  oriented to person, place, and time. She appears well-developed and well-nourished. No distress.  Pulmonary/Chest: Effort normal.  Genitourinary: There is rash (4-5 slightly erythematous papules with what looks like ruptured central vesicles.) on the left labia. Cervix exhibits discharge (White coating on cervix). Cervix exhibits no motion tenderness. No bleeding in the vagina. No foreign body around the vagina. Vaginal discharge (small amount of frothy green discharge.) found.  Neurological: She is alert and oriented to person, place, and time.    ED Course  Procedures (including critical care time)  Labs Review Labs Reviewed  POCT URINALYSIS DIP (DEVICE) - Abnormal; Notable for the following:    Glucose, UA 250 (*)    Bilirubin Urine SMALL (*)    Ketones, ur TRACE (*)    Hgb urine dipstick TRACE (*)    Protein, ur 100 (*)    Urobilinogen, UA 2.0 (*)    Nitrite POSITIVE (*)    Leukocytes, UA TRACE (*)    All other components within normal limits  HSV CULTURE AND TYPING  URINE CULTURE  POCT PREGNANCY, URINE  CERVICOVAGINAL ANCILLARY ONLY    Imaging Review No results found.   MDM   1. UTI (lower urinary tract infection)   2. Genital lesion, female    UA is concerning for UTI. Urine sent for culture. Treat with  Keflex.  Genital lesions concerning for HSV. Will cover with Valtrex while awaiting culture results. Additional swabs collected for STD testing. We'll call with results.  I did recommend follow-up with her PCP as she has persistent amenorrhea since stopping depo.    Charm Rings, MD 03/11/16 [phone]

## 2016-03-11 NOTE — ED Notes (Signed)
Patient reports vaginal irritation since 7/13.  Patient reports a vaginal discharge.  Has burning with urination that started 2 days ago.  Patient noticed vaginal bumps on labia today.   Patient reports last depo shot was one year ago.  No period since then.   Patient has a new sexual partner, had sex on 7/11

## 2016-03-12 LAB — CERVICOVAGINAL ANCILLARY ONLY
Chlamydia: NEGATIVE
Neisseria Gonorrhea: NEGATIVE

## 2016-03-12 LAB — URINE CULTURE

## 2016-03-13 LAB — HSV CULTURE AND TYPING

## 2016-03-13 LAB — CERVICOVAGINAL ANCILLARY ONLY: Wet Prep (BD Affirm): NEGATIVE

## 2016-03-15 ENCOUNTER — Telehealth (HOSPITAL_COMMUNITY): Payer: Self-pay | Admitting: *Deleted

## 2016-03-15 NOTE — Telephone Encounter (Signed)
Call back completed. Patient reports lesions are improving. STD education provided.

## 2016-04-23 ENCOUNTER — Encounter (HOSPITAL_COMMUNITY): Payer: Self-pay | Admitting: Nurse Practitioner

## 2016-04-23 ENCOUNTER — Ambulatory Visit (HOSPITAL_COMMUNITY)
Admission: EM | Admit: 2016-04-23 | Discharge: 2016-04-23 | Disposition: A | Discharge status: Home / Self Care | Payer: Medicaid Other | Attending: Internal Medicine | Admitting: Internal Medicine

## 2016-04-23 DIAGNOSIS — N898 Other specified noninflammatory disorders of vagina: Secondary | ICD-10-CM | POA: Diagnosis present

## 2016-04-23 DIAGNOSIS — N760 Acute vaginitis: Secondary | ICD-10-CM | POA: Diagnosis not present

## 2016-04-23 LAB — POCT URINALYSIS DIP (DEVICE)
Bilirubin Urine: NEGATIVE
Glucose, UA: NEGATIVE mg/dL
Hgb urine dipstick: NEGATIVE
Ketones, ur: NEGATIVE mg/dL
Nitrite: NEGATIVE
Protein, ur: 100 mg/dL — AB
Specific Gravity, Urine: >=1.03 (ref 1.005–1.030)
Urobilinogen, UA: 0.2 mg/dL (ref 0.0–1.0)
pH: 6 (ref 5.0–8.0)

## 2016-04-23 LAB — POCT PREGNANCY, URINE: Preg Test, Ur: NEGATIVE

## 2016-04-23 MED ORDER — METRONIDAZOLE 50 MG/ML ORAL SUSPENSION: 500.0000 mg | Freq: Two times a day (BID) | ORAL | 0 refills | Status: AC

## 2016-04-23 MED ORDER — FLUCONAZOLE 10 MG/ML PO SUSR: 200.0000 mg | Freq: Once | ORAL | 0 refills | Status: AC

## 2016-04-23 NOTE — ED Triage Notes (Addendum)
Pt c/o 1 week history of white vaginal discharge and irritation. She denies any abd pain, n/v. She tried OTC yeast cream with no relief. She reports history genital herpes but does not feel her current symptoms are related.  She is alert, breathing easily.

## 2016-04-23 NOTE — Discharge Instructions (Signed)
Prescriptions for metronidazole (for bacterial vaginosis/gardnerella) and fluconazole (for candida/yeast) were sent to the [facility] on [address].  Recheck or followup with [facility] for further evaluation if symptoms persist or are not improving in several days.  No sign of herpes virus outbreak on exam today.

## 2016-04-23 NOTE — ED Provider Notes (Signed)
[facility]    CSN: [id number] Arrival date & time: 04/23/16  1312  First Provider Contact:  First MD Initiated Contact with Patient 04/23/16 1412        History   Chief Complaint Chief Complaint  Patient presents with  . Vaginal Discharge    HPI Tamara Foster is a 25 y.o. female. She presents today with possible yeast infection, she tried an over-the-counter 3 day suppository regimen that was not helpful. She describes diffuse vaginal irritation, and some white discharge for the last week. No dysuria, no urinary frequency. No vaginal bleeding. No change in bowel movements. No abdominal or pelvic pain. No fever.  HPI  Past Medical History:  Diagnosis Date  . Eczema     There are no active problems to display for this patient.   History reviewed. No pertinent surgical history.  OB History    Gravida Para Term Preterm AB Living   1 1 1     1    SAB TAB Ectopic Multiple Live Births           1       Home Medications    Takes no meds regularly   Family History Family History  Problem Relation Age of Onset  . Cancer Father     bone  . Diabetes Father   . Cancer Paternal Aunt     breast    Social History Social History  Substance Use Topics  . Smoking status: Never Smoker  . Smokeless tobacco: Never Used  . Alcohol use No     Allergies   Review of patient's allergies indicates no known allergies.   Review of Systems Review of Systems  All other systems reviewed and are negative.    Physical Exam Triage Vital Signs ED Triage Vitals  Enc Vitals Group     BP 04/23/16 1359 144/90     Pulse Rate 04/23/16 1359 81     Resp 04/23/16 1359 16     Temp 04/23/16 1359 98.9 F (37.2 C)     Temp Source 04/23/16 1359 Oral     SpO2 04/23/16 1359 100 %     Weight --      Height --      Pain Score 04/23/16 1406 3   Updated Vital Signs BP 144/90 (BP Location: Left Arm)   Pulse 81   Temp 98.9 F (37.2 C) (Oral)   Resp 16   SpO2 100%    Physical Exam  Constitutional: She is oriented to person, place, and time. No distress.  Alert, nicely groomed  HENT:  Head: Atraumatic.  Eyes:  Conjugate gaze, no eye redness/drainage  Neck: Neck supple.  Cardiovascular: Normal rate.   Pulmonary/Chest: No respiratory distress.  Abdominal: She exhibits no distension.  Genitourinary:  Genitourinary Comments: Lots of thick clumpy adherent white vaginal discharge, no odor. Vaginal and cervical mucosa diffusely appears slightly inflamed. No focal ulcerations or lesions. Samples taken for wet prep and for GC/chlamydia evaluation.  Musculoskeletal: Normal range of motion.  No leg swelling  Neurological: She is alert and oriented to person, place, and time.  Skin: Skin is warm and dry.  No cyanosis  Nursing note and vitals reviewed.    UC Treatments / Results  Labs (all labs ordered are listed, but only abnormal results are displayed)  Results for orders placed or performed during the hospital encounter of 04/23/16  POCT urinalysis dip (device)  Result Value Ref Range   Glucose, UA NEGATIVE NEGATIVE mg/dL  Bilirubin Urine NEGATIVE NEGATIVE   Ketones, ur NEGATIVE NEGATIVE mg/dL   Specific Gravity, Urine >=1.030 1.005 - 1.030   Hgb urine dipstick NEGATIVE NEGATIVE   pH 6.0 5.0 - 8.0   Protein, ur 100 (A) NEGATIVE mg/dL   Urobilinogen, UA 0.2 0.0 - 1.0 mg/dL   Nitrite NEGATIVE NEGATIVE   Leukocytes, UA TRACE (A) NEGATIVE  Pregnancy, urine POC  Result Value Ref Range   Preg Test, Ur NEGATIVE NEGATIVE    Procedures Procedures (including critical care time) none  Final Clinical Impressions(s) / UC Diagnoses   Final diagnoses:  Vaginitis    New Prescriptions New Prescriptions   FLUCONAZOLE (DIFLUCAN) 10 MG/ML SUSPENSION    Take 20 mLs (200 mg total) by mouth once. Repeat dose in 3 days.   METRONIDAZOLE (FLAGYL) 50 MG/ML ORAL SUSPENSION    Take 10 mLs (500 mg total) by mouth 2 (two) times daily.     Eustace Moore,  MD 05/06/16 1329

## 2016-04-24 LAB — CERVICOVAGINAL ANCILLARY ONLY
Chlamydia: NEGATIVE
Neisseria Gonorrhea: NEGATIVE
Wet Prep (BD Affirm): POSITIVE — AB

## 2016-04-30 ENCOUNTER — Telehealth (HOSPITAL_COMMUNITY): Payer: Self-pay | Admitting: Emergency Medicine

## 2016-04-30 NOTE — Telephone Encounter (Signed)
-----   Message from Eustace MooreLaura W Murray, MD sent at 04/25/2016  4:52 PM EDT ----- Please let patient know that test for gardnerella (bacterial vaginosis) was positive.  [facility]   Recheck or followup with PCP/[facility] for further evaluation if symptoms persist.  LM

## 2016-04-30 NOTE — Telephone Encounter (Signed)
Called pt and notified of recent lab results from visit 8/28 Pt ID'd properly... Reports feeling better and sx have subsided... Also reports she's still taking flagyl and begun her menstrual cycle.  Adv pt if sx are not getting better to return or to f/u w/PCP Pt verb understanding.

## 2016-05-09 ENCOUNTER — Encounter (HOSPITAL_COMMUNITY): Payer: Self-pay | Admitting: Family Medicine

## 2016-05-09 ENCOUNTER — Ambulatory Visit (HOSPITAL_COMMUNITY)
Admission: EM | Admit: 2016-05-09 | Discharge: 2016-05-09 | Disposition: A | Discharge status: Home / Self Care | Payer: Medicaid Other | Attending: Family Medicine | Admitting: Family Medicine

## 2016-05-09 DIAGNOSIS — H66002 Acute suppurative otitis media without spontaneous rupture of ear drum, left ear: Secondary | ICD-10-CM

## 2016-05-09 MED ORDER — AMOXICILLIN 500 MG PO TABS: 1000.0000 mg | ORAL_TABLET | Freq: Three times a day (TID) | ORAL | 0 refills | Status: AC

## 2016-05-09 NOTE — ED Provider Notes (Signed)
CSN: [id number]     Arrival date & time 05/09/16  1156 History   First MD Initiated Contact with Patient 05/09/16 1336     Chief Complaint  Patient presents with  . Otalgia   (Consider location/radiation/quality/duration/timing/severity/associated sxs/prior Treatment) [name] is a well-appearing 25 y.o female, presents today for left ear pain x 3 days. She is concern for ear infection. She reports decreased in hearing in her left ear. She denies ear discharge or tinnitus. She also denies congestion or running nose. She had sore throat but the sore throat resolved.       Past Medical History:  Diagnosis Date  . Eczema    History reviewed. No pertinent surgical history. Family History  Problem Relation Age of Onset  . Cancer Father     bone  . Diabetes Father   . Cancer Paternal Aunt     breast   Social History  Substance Use Topics  . Smoking status: Never Smoker  . Smokeless tobacco: Never Used  . Alcohol use No   OB History    Gravida Para Term Preterm AB Living   1 1 1     1    SAB TAB Ectopic Multiple Live Births           1     Review of Systems  Constitutional: Negative for chills, fatigue and fever.  HENT: Positive for ear pain and sore throat. Negative for congestion, ear discharge and rhinorrhea.        Sore throat resolved. No tinnitus. +hearing difficulty  Eyes: Negative for pain, discharge and itching.  Respiratory: Negative for cough and shortness of breath.   Cardiovascular: Negative for chest pain and palpitations.  Gastrointestinal: Negative for abdominal pain, diarrhea, nausea and vomiting.  Musculoskeletal: Negative for myalgias.  Neurological: Negative for dizziness, weakness and headaches.    Allergies  Review of patient's allergies indicates no known allergies.  Home Medications   Prior to Admission medications   Medication Sig Start Date End Date Taking? Authorizing Provider  amoxicillin (AMOXIL) 500 MG tablet Take 2 tablets (1,000 mg  total) by mouth 3 (three) times daily. 05/09/16 05/16/16  Lucia EstelleFeng Dula Havlik, NP   Meds Ordered and Administered this Visit  Medications - No data to display  BP 123/80   Pulse 81   Temp 98 F (36.7 C)   Resp 18   LMP 04/27/2016   SpO2 96%  No data found.   Physical Exam  Constitutional: She appears well-developed and well-nourished.  HENT:  Head: Normocephalic and atraumatic.  Right Ear: External ear normal.  Left Ear: External ear normal.  Right ear unremarkable. She does exhibits TM redness on her left ear.   Eyes: EOM are normal. Pupils are equal, round, and reactive to light.  Neck: Normal range of motion. Neck supple.  Cardiovascular: Normal rate, regular rhythm and normal heart sounds.   Pulmonary/Chest: Effort normal and breath sounds normal. No respiratory distress.  Abdominal: Soft. Bowel sounds are normal. She exhibits no distension. There is no tenderness.  Nursing note and vitals reviewed.   Urgent Care Course   Clinical Course    Procedures (including critical care time)  Labs Review Labs Reviewed - No data to display  Imaging Review No results found.   Visual Acuity Review  Right Eye Distance:   Left Eye Distance:   Bilateral Distance:    Right Eye Near:   Left Eye Near:    Bilateral Near:  MDM   1. Acute suppurative otitis media of left ear without spontaneous rupture of tympanic membrane, recurrence not specified    Prescriptions given (see above). Reviewed directions for usage and side effects. Patient states understanding and will call with questions or problems. Patient instructed to call or follow up with his/her primary care doctor if failure to improve or change in symptoms. Discharge instruction given.    Lucia Estelle, NP 05/09/16 2205

## 2016-05-09 NOTE — ED Triage Notes (Signed)
Pt here for left ear pain that started Monday. Sts she had a cold prior.

## 2017-02-08 ENCOUNTER — Encounter (HOSPITAL_COMMUNITY): Payer: Self-pay | Admitting: *Deleted

## 2017-02-08 ENCOUNTER — Ambulatory Visit (HOSPITAL_COMMUNITY)
Admission: EM | Admit: 2017-02-08 | Discharge: 2017-02-08 | Disposition: A | Discharge status: Home / Self Care | Payer: Medicaid Other | Attending: Internal Medicine | Admitting: Internal Medicine

## 2017-02-08 DIAGNOSIS — Z711 Person with feared health complaint in whom no diagnosis is made: Secondary | ICD-10-CM

## 2017-02-08 DIAGNOSIS — B9689 Other specified bacterial agents as the cause of diseases classified elsewhere: Secondary | ICD-10-CM | POA: Insufficient documentation

## 2017-02-08 DIAGNOSIS — N760 Acute vaginitis: Secondary | ICD-10-CM | POA: Insufficient documentation

## 2017-02-08 DIAGNOSIS — N898 Other specified noninflammatory disorders of vagina: Secondary | ICD-10-CM | POA: Diagnosis not present

## 2017-02-08 MED ORDER — FLUCONAZOLE 40 MG/ML PO SUSR: ORAL | 0 refills | Status: DC

## 2017-02-08 MED ORDER — METRONIDAZOLE 50 MG/ML ORAL SUSPENSION: ORAL | 0 refills | Status: DC

## 2017-02-08 NOTE — ED Provider Notes (Signed)
CSN: [id number]     Arrival date & time 02/08/17  1002 History   First MD Initiated Contact with Patient 02/08/17 1052     Chief Complaint  Patient presents with  . Vaginal Discharge   (Consider location/radiation/quality/duration/timing/severity/associated sxs/prior Treatment) 26 year old female complaining of vaginal discharge for almost 2 weeks. She states there is also itching and redness. She describes the discharge as whitish and a possibly thinner discharge as well. She used the three-day OTC treatment without benefit. Her birth control his but no barrier control. Denies pelvic pain.      Past Medical History:  Diagnosis Date  . Eczema    History reviewed. No pertinent surgical history. Family History  Problem Relation Age of Onset  . Cancer Father        bone  . Diabetes Father   . Cancer Paternal Aunt        breast   Social History  Substance Use Topics  . Smoking status: Never Smoker  . Smokeless tobacco: Never Used  . Alcohol use No   OB History    Gravida Para Term Preterm AB Living   1 1 1     1    SAB TAB Ectopic Multiple Live Births           1     Review of Systems  Constitutional: Negative.  Negative for fever.  Respiratory: Negative.   Gastrointestinal: Negative.   Genitourinary: Positive for vaginal discharge. Negative for dysuria, frequency, menstrual problem and pelvic pain.  Musculoskeletal: Negative.   Neurological: Negative.   All other systems reviewed and are negative.   Allergies  Patient has no known allergies.  Home Medications   Prior to Admission medications   Medication Sig Start Date End Date Taking? Authorizing Provider  fluconazole (DIFLUCAN) 40 MG/ML suspension Take 4 ml po now and repeat in 2 days 02/08/17   Hayden Rasmussen, NP  metroNIDAZOLE (FLAGYL) 50 mg/ml oral suspension Take 10 ml po bid x 7 days 02/08/17   Hayden Rasmussen, NP   Meds Ordered and Administered this Visit  Medications - No data to display  BP 118/72 (BP  Location: Right Arm)   Pulse 78   Temp 98.6 F (37 C) (Oral)   Resp 18   LMP 01/25/2017   SpO2 100%  No data found.   Physical Exam  Constitutional: She is oriented to person, place, and time. She appears well-developed and well-nourished.  Eyes: EOM are normal.  Neck: Neck supple.  Cardiovascular: Normal rate.   Pulmonary/Chest: Effort normal.  Genitourinary:  Genitourinary Comments: Normal external female genitalia. There is a creamy white to slightly green discharge within the vaginal vault and covering the vaginal walls. Cervix is also covered with discharge. Minor erythema to the ectocervix. No lesions are seen. No CMT or adnexal tenderness.  Musculoskeletal: Normal range of motion.  Neurological: She is alert and oriented to person, place, and time.  Skin: Skin is warm and dry.  Psychiatric: She has a normal mood and affect.  Nursing note and vitals reviewed.   Urgent Care Course     Procedures (including critical care time)  Labs Review Labs Reviewed  CERVICOVAGINAL ANCILLARY ONLY    Imaging Review No results found.   Visual Acuity Review  Right Eye Distance:   Left Eye Distance:   Bilateral Distance:    Right Eye Near:   Left Eye Near:    Bilateral Near:         MDM   1. Vaginal  discharge   2. Bacterial vaginosis   3. Concern about STD in female without diagnosis    Take medications as directed. The results of your test should be back in one to 2 days. If there are any other organisms or diseases discovered on that test we will call you and likely be able to treat, phone. Meds ordered this encounter  Medications  . metroNIDAZOLE (FLAGYL) 50 mg/ml oral suspension    Sig: Take 10 ml po bid x 7 days    Dispense:  150 mL    Refill:  0    Order Specific Question:   Supervising Provider    Answer:   Eustace MooreMURRAY, LAURA W [[id number]]  . fluconazole (DIFLUCAN) 40 MG/ML suspension    Sig: Take 4 ml po now and repeat in 2 days    Dispense:  10 mL    Refill:   0    Order Specific Question:   Supervising Provider    Answer:   Eustace MooreMURRAY, LAURA W [[id number]]       Hayden RasmussenMabe, Sloan Takagi, NP 02/08/17 1123

## 2017-02-08 NOTE — Discharge Instructions (Signed)
Take medications as directed. The results of your test should be back in one to 2 days. If there are any other organisms or diseases discovered on that test we will call you and likely be able to treat, phone.

## 2017-02-08 NOTE — ED Triage Notes (Signed)
Pt  Reports   Symptoms  Of  Vaginal   Discharge   With  Irritation   For   Several  Weeks       Pt  denys  Any  Pain   Pt   Is   Awake  And  Alert  And  Oriented

## 2017-02-11 LAB — CERVICOVAGINAL ANCILLARY ONLY
Bacterial vaginitis: POSITIVE — AB
Candida vaginitis: NEGATIVE
Chlamydia: NEGATIVE
Neisseria Gonorrhea: NEGATIVE
Trichomonas: NEGATIVE

## 2017-02-22 ENCOUNTER — Encounter (HOSPITAL_COMMUNITY): Payer: Self-pay | Admitting: Emergency Medicine

## 2017-02-22 ENCOUNTER — Ambulatory Visit (HOSPITAL_COMMUNITY)
Admission: EM | Admit: 2017-02-22 | Discharge: 2017-02-22 | Disposition: A | Discharge status: Home / Self Care | Payer: Medicaid Other | Attending: Internal Medicine | Admitting: Internal Medicine

## 2017-02-22 DIAGNOSIS — B9689 Other specified bacterial agents as the cause of diseases classified elsewhere: Secondary | ICD-10-CM | POA: Diagnosis not present

## 2017-02-22 DIAGNOSIS — L309 Dermatitis, unspecified: Secondary | ICD-10-CM | POA: Insufficient documentation

## 2017-02-22 DIAGNOSIS — N760 Acute vaginitis: Secondary | ICD-10-CM | POA: Diagnosis not present

## 2017-02-22 DIAGNOSIS — N939 Abnormal uterine and vaginal bleeding, unspecified: Secondary | ICD-10-CM | POA: Insufficient documentation

## 2017-02-22 DIAGNOSIS — N898 Other specified noninflammatory disorders of vagina: Secondary | ICD-10-CM | POA: Insufficient documentation

## 2017-02-22 MED ORDER — CLINDAMYCIN PHOSPHATE 2 % VA CREA: 1.0000 | TOPICAL_CREAM | Freq: Every day | VAGINAL | 0 refills | Status: DC

## 2017-02-22 NOTE — ED Provider Notes (Signed)
CSN: [id number]     Arrival date & time 02/22/17  1005 History   None    Chief Complaint  Patient presents with  . Vaginal Bleeding   (Consider location/radiation/quality/duration/timing/severity/associated sxs/prior Treatment) Tamara Foster is a 26 y.o. female with a past history of eczema, and BV, who [facility] with a chief complaint of vaginal discharge. She was seen here on 02/08/2017, tested for BV, she was positive for Gardnerella. She was treated in clinic with prescriptions for metronidazole, and Diflucan, however her symptoms have remained. She states she has not been sexually active, tests for gonorrhea and chlamydia were negative. She has no acute symptoms, no pelvic pain, vaginal pain, vaginal bleeding, flank pain, abdominal pain, nausea, vomiting, or lymphadenopathy. Otherwise has no other complaints.   The history is provided by the patient.    Past Medical History:  Diagnosis Date  . Eczema    History reviewed. No pertinent surgical history. Family History  Problem Relation Age of Onset  . Cancer Father        bone  . Diabetes Father   . Cancer Paternal Aunt        breast   Social History  Substance Use Topics  . Smoking status: Never Smoker  . Smokeless tobacco: Never Used  . Alcohol use No   OB History    Gravida Para Term Preterm AB Living   1 1 1     1    SAB TAB Ectopic Multiple Live Births           1     Review of Systems  Constitutional: Negative.   HENT: Negative.   Respiratory: Negative.   Cardiovascular: Negative.   Gastrointestinal: Negative.   Genitourinary: Positive for vaginal discharge. Negative for dyspareunia, dysuria, frequency, pelvic pain, vaginal bleeding and vaginal pain.  Skin: Negative.   Neurological: Negative.     Allergies  Patient has no known allergies.  Home Medications   Prior to Admission medications   Medication Sig Start Date End Date Taking? Authorizing Provider  clindamycin  (CLEOCIN) 2 % vaginal cream Place 1 Applicatorful vaginally at bedtime. 02/22/17   Dorena BodoKennard, Javel Hersh, NP   Meds Ordered and Administered this Visit  Medications - No data to display  BP 126/89 (BP Location: Right Arm)   Pulse 81   Temp 98.9 F (37.2 C) (Oral)   Resp 18   LMP 01/25/2017   SpO2 98%  No data found.   Physical Exam  Constitutional: She is oriented to person, place, and time. She appears well-developed and well-nourished. No distress.  HENT:  Head: Normocephalic and atraumatic.  Right Ear: External ear normal.  Left Ear: External ear normal.  Eyes: Conjunctivae are normal.  Neck: Normal range of motion.  Genitourinary:  Genitourinary Comments: Deferred urine cytology obtained  Neurological: She is alert and oriented to person, place, and time.  Skin: Skin is warm and dry. Capillary refill takes less than 2 seconds. No rash noted. She is not diaphoretic. No erythema.  Psychiatric: She has a normal mood and affect. Her behavior is normal.  Nursing note and vitals reviewed.   Urgent Care Course     Procedures (including critical care time)  Labs Review Labs Reviewed  URINE CYTOLOGY ANCILLARY ONLY    Imaging Review No results found.     MDM   1. Bacterial vaginosis    Records, and laboratory results from previous visit were consulted. She appears to be adequately treated, however  states continuing to have symptoms. We'll try topical clindamycin gel, and recommend following up with gynecology if symptoms persist. Urine cytology obtained, checking for gonorrhea, chlamydia, Trichomonas, yeast, and BV. We'll notify if positive.    Dorena Bodo, NP 02/22/17 1201

## 2017-02-22 NOTE — Discharge Instructions (Signed)
Your urine is being tested for multiple types of infections and I have started you on a topical gel. Apply once nightly at bed time. If your symptoms persist, follow up with your gynecologist or return to clinic.

## 2017-02-22 NOTE — ED Triage Notes (Signed)
[facility] a [facility].The patient was seen and tested on 02/08/2017.

## 2017-02-25 LAB — URINE CYTOLOGY ANCILLARY ONLY
Chlamydia: NEGATIVE
Neisseria Gonorrhea: NEGATIVE
Trichomonas: NEGATIVE

## 2017-02-28 LAB — URINE CYTOLOGY ANCILLARY ONLY
Bacterial vaginitis: NEGATIVE
Candida vaginitis: NEGATIVE

## 2019-11-26 ENCOUNTER — Ambulatory Visit: Payer: Medicaid Other | Attending: Internal Medicine

## 2019-11-26 DIAGNOSIS — Z23 Encounter for immunization: Secondary | ICD-10-CM

## 2019-11-26 NOTE — Progress Notes (Signed)
   Covid-19 Vaccination Clinic  Name:  Domanique Huesman    MRN: [id number] DOB: [birth year]  11/26/2019  Ms. Hamric was observed post Covid-19 immunization for 15 minutes without incident. She was provided with Vaccine Information Sheet and instruction to access the V-Safe system.   Ms. Daddona was instructed to call 911 with any severe reactions post vaccine: Marland Kitchen Difficulty breathing  . Swelling of face and throat  . A fast heartbeat  . A bad rash all over body  . Dizziness and weakness   Immunizations Administered    Name Date Dose VIS Date Route   Pfizer COVID-19 Vaccine 11/26/2019 10:08 AM 0.3 mL 08/07/2019 Intramuscular   Manufacturer: [employer], [employer]   Lot: [id number]   NDC: [address]-7

## 2019-12-23 ENCOUNTER — Ambulatory Visit: Payer: Medicaid Other | Attending: Internal Medicine

## 2019-12-23 DIAGNOSIS — Z23 Encounter for immunization: Secondary | ICD-10-CM

## 2019-12-23 NOTE — Progress Notes (Signed)
   Covid-19 Vaccination Clinic  Name:  Tamara Foster    MRN: [id number] DOB: [birth year]  12/23/2019  [name] was observed post Covid-19 immunization for 15 minutes without incident. She was provided with Vaccine Information Sheet and instruction to access the V-Safe system.   [name] was instructed to call 911 with any severe reactions post vaccine: Marland Kitchen Difficulty breathing  . Swelling of face and throat  . A fast heartbeat  . A bad rash all over body  . Dizziness and weakness   Immunizations Administered    Name Date Dose VIS Date Route   Pfizer COVID-19 Vaccine 12/23/2019 10:52 AM 0.3 mL 10/21/2018 Intramuscular   Manufacturer: [employer], [employer]   Lot: [id number]   NDC: [address]-9

## 2020-07-27 ENCOUNTER — Other Ambulatory Visit: Payer: Self-pay

## 2020-07-27 ENCOUNTER — Encounter (HOSPITAL_BASED_OUTPATIENT_CLINIC_OR_DEPARTMENT_OTHER): Payer: Self-pay | Admitting: Obstetrics and Gynecology

## 2020-07-27 NOTE — Progress Notes (Signed)
Spoke w/ via phone for pre-op interview--- PT Lab needs dos----  No per anes. /  Pre-op orders pending  (pt blood type in epic B negative)           Lab results------ no COVID test ------ 07-28-2020 @ 1200 Arrive at ------- 0530 NPO after MN  Medications to take morning of surgery ----- NONE Diabetic medication ----- n/a Patient Special Instructions ----- n/a Pre-Op special Istructions ----- case just added on today, pre-op orders pending Patient verbalized understanding of instructions that were given at this phone interview. Patient denies shortness of breath, chest pain, fever, cough at this phone interview.

## 2020-07-28 ENCOUNTER — Other Ambulatory Visit (HOSPITAL_COMMUNITY)
Admission: RE | Admit: 2020-07-28 | Discharge: 2020-07-28 | Disposition: A | Discharge status: Home / Self Care | Payer: BC Managed Care – PPO | Source: Ambulatory Visit | Attending: Obstetrics and Gynecology | Admitting: Obstetrics and Gynecology

## 2020-07-28 DIAGNOSIS — Z20822 Contact with and (suspected) exposure to covid-19: Secondary | ICD-10-CM | POA: Insufficient documentation

## 2020-07-28 DIAGNOSIS — O021 Missed abortion: Secondary | ICD-10-CM | POA: Diagnosis present

## 2020-07-28 DIAGNOSIS — O020 Blighted ovum and nonhydatidiform mole: Secondary | ICD-10-CM | POA: Diagnosis not present

## 2020-07-28 DIAGNOSIS — Z01812 Encounter for preprocedural laboratory examination: Secondary | ICD-10-CM | POA: Insufficient documentation

## 2020-07-28 LAB — SARS CORONAVIRUS 2 (TAT 6-24 HRS): SARS Coronavirus 2: NEGATIVE

## 2020-07-28 NOTE — Anesthesia Preprocedure Evaluation (Addendum)
Anesthesia Evaluation  Patient identified by MRN, date of birth, ID band Patient awake    Reviewed: Allergy & Precautions, NPO status , Patient's Chart, lab work & pertinent test results  Airway Mallampati: II  TM Distance: >3 FB Neck ROM: Full    Dental no notable dental hx. (+) Teeth Intact, Dental Advisory Given   Pulmonary neg pulmonary ROS,    Pulmonary exam normal breath sounds clear to auscultation       Cardiovascular negative cardio ROS Normal cardiovascular exam Rhythm:Regular Rate:Normal     Neuro/Psych negative neurological ROS     GI/Hepatic negative GI ROS, Neg liver ROS,   Endo/Other  negative endocrine ROS  Renal/GU negative Renal ROS     Musculoskeletal negative musculoskeletal ROS (+)   Abdominal (+) + obese,   Peds  Hematology   Anesthesia Other Findings   Reproductive/Obstetrics                            Anesthesia Physical Anesthesia Plan  ASA: II  Anesthesia Plan: MAC   Post-op Pain Management:    Induction:   PONV Risk Score and Plan: 3 and Treatment may vary due to age or medical condition and Midazolam  Airway Management Planned: Nasal Cannula and Natural Airway  Additional Equipment: None  Intra-op Plan:   Post-operative Plan:   Informed Consent: I have reviewed the patients History and Physical, chart, labs and discussed the procedure including the risks, benefits and alternatives for the proposed anesthesia with the patient or authorized representative who has indicated his/her understanding and acceptance.     Dental advisory given  Plan Discussed with: CRNA and Anesthesiologist  Anesthesia Plan Comments:       Anesthesia Quick Evaluation

## 2020-07-28 NOTE — H&P (Signed)
Tamara Foster is an 29 y.o. female G12P1011 w blighted ovum.  RPOC after cytotec induction.  Pt desires definitive management.  D/W pt r/b/a and process od D&C.     Pertinent Gynecological History: OB History: G2, P1011 G1 SVD 7#+ G2 blighted ovum  No abn pap, due +Chl    Menstrual History: No LMP recorded. (Menstrual status: Other).    Past Medical History:  Diagnosis Date  . Eczema   . Miscarriage     Past Surgical History:  Procedure Laterality Date  . NO PAST SURGERIES    . WISDOM TOOTH EXTRACTION      Family History  Problem Relation Age of Onset  . Cancer Father        bone  . Diabetes Father   . Cancer Paternal Aunt        breast    Social History:  reports that she has never smoked. She has never used smokeless tobacco. She reports that she does not drink alcohol and does not use drugs.  Allergies: No Known Allergies  No medications prior to admission.    Review of Systems  Constitutional: Negative.   HENT: Negative.   Eyes: Negative.   Respiratory: Negative.   Cardiovascular: Negative.   Gastrointestinal: Negative.   Genitourinary: Negative.   Musculoskeletal: Negative.   Skin: Negative.   Neurological: Negative.   Psychiatric/Behavioral: Negative.     Height 5\' 7"  (1.702 m), weight 90.7 kg. Physical Exam Constitutional:      Appearance: Normal appearance.  HENT:     Head: Normocephalic and atraumatic.  Cardiovascular:     Rate and Rhythm: Normal rate.  Pulmonary:     Effort: Pulmonary effort is normal.     Breath sounds: Normal breath sounds.  Abdominal:     General: Bowel sounds are normal.     Palpations: Abdomen is soft.  Genitourinary:    General: Normal vulva.  Musculoskeletal:        General: Normal range of motion.     Cervical back: Normal range of motion and neck supple.  Skin:    General: Skin is warm and dry.  Neurological:     General: No focal deficit present.     Mental Status: She is alert and oriented to person,  place, and time.  Psychiatric:        Mood and Affect: Mood normal.        Behavior: Behavior normal.   10/21 11/21 w blighted ovum cytotec x 2 courses [place] 11/21 RPOC for D&C  Assessment/Plan: G2P1011 w RPOC after cytotec induction D&C, d/w pt r/b/a  Tamara Foster 07/28/2020, 8:25 AM

## 2020-07-29 ENCOUNTER — Ambulatory Visit (HOSPITAL_BASED_OUTPATIENT_CLINIC_OR_DEPARTMENT_OTHER): Payer: BC Managed Care – PPO | Admitting: Anesthesiology

## 2020-07-29 ENCOUNTER — Encounter (HOSPITAL_BASED_OUTPATIENT_CLINIC_OR_DEPARTMENT_OTHER)
Admission: RE | Disposition: A | Discharge status: Home / Self Care | Payer: Self-pay | Source: Ambulatory Visit | Attending: Obstetrics and Gynecology

## 2020-07-29 ENCOUNTER — Ambulatory Visit (HOSPITAL_BASED_OUTPATIENT_CLINIC_OR_DEPARTMENT_OTHER)
Admission: RE | Admit: 2020-07-29 | Discharge: 2020-07-29 | Disposition: A | Discharge status: Home / Self Care | Payer: BC Managed Care – PPO | Source: Ambulatory Visit | Attending: Obstetrics and Gynecology | Admitting: Obstetrics and Gynecology

## 2020-07-29 ENCOUNTER — Encounter (HOSPITAL_BASED_OUTPATIENT_CLINIC_OR_DEPARTMENT_OTHER): Payer: Self-pay | Admitting: Obstetrics and Gynecology

## 2020-07-29 DIAGNOSIS — O020 Blighted ovum and nonhydatidiform mole: Secondary | ICD-10-CM | POA: Insufficient documentation

## 2020-07-29 DIAGNOSIS — O034 Incomplete spontaneous abortion without complication: Secondary | ICD-10-CM | POA: Diagnosis present

## 2020-07-29 DIAGNOSIS — Z20822 Contact with and (suspected) exposure to covid-19: Secondary | ICD-10-CM | POA: Insufficient documentation

## 2020-07-29 DIAGNOSIS — Z9889 Other specified postprocedural states: Secondary | ICD-10-CM

## 2020-07-29 HISTORY — DX: Incomplete spontaneous abortion without complication: O03.4

## 2020-07-29 HISTORY — DX: Complete or unspecified spontaneous abortion without complication: O03.9

## 2020-07-29 HISTORY — PX: DILATION AND EVACUATION: SHX1459

## 2020-07-29 HISTORY — DX: Other specified postprocedural states: Z98.890

## 2020-07-29 LAB — CBC
HCT: 30.3 % — ABNORMAL LOW (ref 36.0–46.0)
Hemoglobin: 9.7 g/dL — ABNORMAL LOW (ref 12.0–15.0)
MCH: 26.7 pg (ref 26.0–34.0)
MCHC: 32 g/dL (ref 30.0–36.0)
MCV: 83.5 fL (ref 80.0–100.0)
Platelets: 235 10*3/uL (ref 150–400)
RBC: 3.63 MIL/uL — ABNORMAL LOW (ref 3.87–5.11)
RDW: 13.2 % (ref 11.5–15.5)
WBC: 5.2 10*3/uL (ref 4.0–10.5)
nRBC: 0 % (ref 0.0–0.2)

## 2020-07-29 LAB — TYPE AND SCREEN
ABO/RH(D): B NEG
Antibody Screen: NEGATIVE

## 2020-07-29 SURGERY — DILATION AND EVACUATION, UTERUS
Anesthesia: Monitor Anesthesia Care | Site: Uterus

## 2020-07-29 MED ORDER — OXYCODONE HCL 5 MG PO TABS: 5.0000 mg | ORAL_TABLET | Freq: Once | ORAL | Status: DC | PRN

## 2020-07-29 MED ORDER — LACTATED RINGERS IV SOLN: INTRAVENOUS | Status: DC

## 2020-07-29 MED ORDER — KETOROLAC TROMETHAMINE 30 MG/ML IJ SOLN
INTRAMUSCULAR | Status: DC | PRN
  Administered 2020-07-29: 30 mg via INTRAVENOUS

## 2020-07-29 MED ORDER — ONDANSETRON HCL 4 MG/2ML IJ SOLN
INTRAMUSCULAR | Status: AC
  Filled 2020-07-29: qty 2

## 2020-07-29 MED ORDER — IBUPROFEN 600 MG PO TABS: 600.0000 mg | ORAL_TABLET | Freq: Four times a day (QID) | ORAL | 1 refills | Status: DC | PRN

## 2020-07-29 MED ORDER — CEFAZOLIN SODIUM-DEXTROSE 1-4 GM/50ML-% IV SOLN
INTRAVENOUS | Status: DC | PRN
  Administered 2020-07-29: 2 g via INTRAVENOUS

## 2020-07-29 MED ORDER — KETOROLAC TROMETHAMINE 30 MG/ML IJ SOLN
INTRAMUSCULAR | Status: AC
  Filled 2020-07-29: qty 1

## 2020-07-29 MED ORDER — MIDAZOLAM HCL 2 MG/2ML IJ SOLN
INTRAMUSCULAR | Status: DC | PRN
  Administered 2020-07-29: 2 mg via INTRAVENOUS

## 2020-07-29 MED ORDER — CEFAZOLIN SODIUM 1 G IJ SOLR
INTRAMUSCULAR | Status: AC
  Filled 2020-07-29: qty 20

## 2020-07-29 MED ORDER — DEXAMETHASONE SODIUM PHOSPHATE 10 MG/ML IJ SOLN
INTRAMUSCULAR | Status: AC
  Filled 2020-07-29: qty 1

## 2020-07-29 MED ORDER — CHLOROPROCAINE HCL 1 % IJ SOLN
INTRAMUSCULAR | Status: DC | PRN
  Administered 2020-07-29: 15 mL

## 2020-07-29 MED ORDER — DEXMEDETOMIDINE (PRECEDEX) IN NS 20 MCG/5ML (4 MCG/ML) IV SYRINGE
PREFILLED_SYRINGE | INTRAVENOUS | Status: AC
  Filled 2020-07-29: qty 5

## 2020-07-29 MED ORDER — PROPOFOL 500 MG/50ML IV EMUL
INTRAVENOUS | Status: DC | PRN
  Administered 2020-07-29: 200 ug/kg/min via INTRAVENOUS

## 2020-07-29 MED ORDER — FENTANYL CITRATE (PF) 100 MCG/2ML IJ SOLN
INTRAMUSCULAR | Status: AC
  Filled 2020-07-29: qty 2

## 2020-07-29 MED ORDER — ACETAMINOPHEN 500 MG PO TABS
ORAL_TABLET | ORAL | Status: AC
  Filled 2020-07-29: qty 2

## 2020-07-29 MED ORDER — DEXMEDETOMIDINE (PRECEDEX) IN NS 20 MCG/5ML (4 MCG/ML) IV SYRINGE
PREFILLED_SYRINGE | INTRAVENOUS | Status: DC | PRN
  Administered 2020-07-29 (×3): 4 ug via INTRAVENOUS

## 2020-07-29 MED ORDER — ACETAMINOPHEN 500 MG PO TABS
1000.0000 mg | ORAL_TABLET | ORAL | Status: AC
  Administered 2020-07-29: 1000 mg via ORAL

## 2020-07-29 MED ORDER — MIDAZOLAM HCL 2 MG/2ML IJ SOLN
INTRAMUSCULAR | Status: AC
  Filled 2020-07-29: qty 2

## 2020-07-29 MED ORDER — OXYCODONE HCL 5 MG/5ML PO SOLN: 5.0000 mg | Freq: Once | ORAL | Status: DC | PRN

## 2020-07-29 MED ORDER — 0.9 % SODIUM CHLORIDE (POUR BTL) OPTIME
TOPICAL | Status: DC | PRN
  Administered 2020-07-29: 500 mL

## 2020-07-29 MED ORDER — POVIDONE-IODINE 10 % EX SWAB: 2.0000 "application " | Freq: Once | CUTANEOUS | Status: DC

## 2020-07-29 MED ORDER — FENTANYL CITRATE (PF) 100 MCG/2ML IJ SOLN
INTRAMUSCULAR | Status: DC | PRN
  Administered 2020-07-29: 50 ug via INTRAVENOUS

## 2020-07-29 MED ORDER — PROPOFOL 500 MG/50ML IV EMUL
INTRAVENOUS | Status: AC
  Filled 2020-07-29: qty 50

## 2020-07-29 MED ORDER — ONDANSETRON HCL 4 MG/2ML IJ SOLN: 4.0000 mg | Freq: Once | INTRAMUSCULAR | Status: DC | PRN

## 2020-07-29 MED ORDER — DEXAMETHASONE SODIUM PHOSPHATE 10 MG/ML IJ SOLN
INTRAMUSCULAR | Status: DC | PRN
  Administered 2020-07-29 (×2): 5 mg via INTRAVENOUS

## 2020-07-29 MED ORDER — HYDROMORPHONE HCL 1 MG/ML IJ SOLN: 0.2500 mg | INTRAMUSCULAR | Status: DC | PRN

## 2020-07-29 MED ORDER — KETOROLAC TROMETHAMINE 30 MG/ML IJ SOLN: 30.0000 mg | Freq: Once | INTRAMUSCULAR | Status: DC | PRN

## 2020-07-29 MED ORDER — ONDANSETRON HCL 4 MG/2ML IJ SOLN
INTRAMUSCULAR | Status: DC | PRN
  Administered 2020-07-29: 4 mg via INTRAVENOUS

## 2020-07-29 MED ORDER — KETOROLAC TROMETHAMINE 15 MG/ML IJ SOLN: 15.0000 mg | INTRAMUSCULAR | Status: DC

## 2020-07-29 MED ORDER — RHO D IMMUNE GLOBULIN 1500 UNIT/2ML IJ SOSY
300.0000 ug | PREFILLED_SYRINGE | Freq: Once | INTRAMUSCULAR | Status: AC
  Administered 2020-07-29: 300 ug via INTRAVENOUS
  Filled 2020-07-29: qty 2

## 2020-07-29 MED ORDER — PROPOFOL 10 MG/ML IV BOLUS
INTRAVENOUS | Status: DC | PRN
  Administered 2020-07-29: 50 mg via INTRAVENOUS

## 2020-07-29 SURGICAL SUPPLY — 22 items
CATH ROBINSON RED A/P 16FR (CATHETERS) ×3 IMPLANT
COVER WAND RF STERILE (DRAPES) ×3 IMPLANT
DECANTER SPIKE VIAL GLASS SM (MISCELLANEOUS) ×3 IMPLANT
FILTER UTR ASPR ASSEMBLY (MISCELLANEOUS) ×3 IMPLANT
GLOVE BIO SURGEON STRL SZ 6.5 (GLOVE) ×2 IMPLANT
GLOVE BIO SURGEONS STRL SZ 6.5 (GLOVE) ×1
GOWN STRL REUS W/TWL LRG LVL3 (GOWN DISPOSABLE) ×3 IMPLANT
HOSE CONNECTING 18IN BERKELEY (TUBING) ×3 IMPLANT
KIT BERKELEY 1ST TRI 3/8 NO TR (MISCELLANEOUS) ×3 IMPLANT
KIT BERKELEY 1ST TRIMESTER 3/8 (MISCELLANEOUS) ×6 IMPLANT
KIT TURNOVER CYSTO (KITS) ×3 IMPLANT
NS IRRIG 1000ML POUR BTL (IV SOLUTION) IMPLANT
NS IRRIG 500ML POUR BTL (IV SOLUTION) ×3 IMPLANT
PACK VAGINAL MINOR WOMEN LF (CUSTOM PROCEDURE TRAY) ×3 IMPLANT
SYR CONTROL 10ML LL (SYRINGE) ×3 IMPLANT
TOWEL OR 17X26 10 PK STRL BLUE (TOWEL DISPOSABLE) ×3 IMPLANT
TRAP TISSUE FILTER (MISCELLANEOUS) ×6 IMPLANT
VACURETTE 10 RIGID CVD (CANNULA) IMPLANT
VACURETTE 6 ASPIR F TIP BERK (CANNULA) IMPLANT
VACURETTE 7MM CVD STRL WRAP (CANNULA) IMPLANT
VACURETTE 8 RIGID CVD (CANNULA) ×3 IMPLANT
VACURETTE 9 RIGID CVD (CANNULA) IMPLANT

## 2020-07-29 NOTE — Brief Op Note (Signed)
07/29/2020  7:54 AM  PATIENT:  Tamara Foster  29 y.o. female  PRE-OPERATIVE DIAGNOSIS:  retained products of conception  POST-OPERATIVE DIAGNOSIS:  retained products of conception  PROCEDURE:  Procedure(s): DILATATION AND EVACUATION (N/A)  SURGEON:  Surgeon(s) and Role:    * Bovard-Stuckert, Alazae Crymes, MD - Primary  ANESTHESIA:   local and MAC  EBL:  10 mL uop and IVF peranesthesia  BLOOD ADMINISTERED:none  DRAINS: none   LOCAL MEDICATIONS USED:  OTHER nesicaine  SPECIMEN:  Source of Specimen:  POC  DISPOSITION OF SPECIMEN:  PATHOLOGY  COUNTS:  YES  TOURNIQUET:  * No tourniquets in log *  DICTATION: .Other Dictation: Dictation Number [id number]  PLAN OF CARE: Discharge to home after PACU  PATIENT DISPOSITION:  PACU - hemodynamically stable.   Delay start of Pharmacological VTE agent (>24hrs) due to surgical blood loss or risk of bleeding: not applicable

## 2020-07-29 NOTE — Interval H&P Note (Signed)
History and Physical Interval Note:  07/29/2020 7:05 AM  Tamara Foster  has presented today for surgery, with the diagnosis of retained products of conception.  The various methods of treatment have been discussed with the patient and family. After consideration of risks, benefits and other options for treatment, the patient has consented to  Procedure(s): DILATATION AND EVACUATION (N/A) as a surgical intervention.  The patient's history has been reviewed, patient examined, no change in status, stable for surgery.  I have reviewed the patient's chart and labs.  Questions were answered to the patient's satisfaction.    She has had some cramping this am.    Augusto Gamble Bovard-Stuckert

## 2020-07-29 NOTE — Anesthesia Procedure Notes (Signed)
Procedure Name: MAC Date/Time: 07/29/2020 7:23 AM Performed by: Suan Halter, CRNA Pre-anesthesia Checklist: Patient identified, Emergency Drugs available, Suction available, Patient being monitored and Timeout performed Patient Re-evaluated:Patient Re-evaluated prior to induction Oxygen Delivery Method: Simple face mask Preoxygenation: Pre-oxygenation with 100% oxygen

## 2020-07-29 NOTE — Anesthesia Postprocedure Evaluation (Signed)
Anesthesia Post Note  Patient: Tamara Foster  Procedure(s) Performed: DILATATION AND EVACUATION (N/A Uterus)     Patient location during evaluation: PACU Anesthesia Type: MAC Level of consciousness: awake and alert Pain management: pain level controlled Vital Signs Assessment: post-procedure vital signs reviewed and stable Respiratory status: spontaneous breathing, nonlabored ventilation, respiratory function stable and patient connected to nasal cannula oxygen Cardiovascular status: stable and blood pressure returned to baseline Postop Assessment: no apparent nausea or vomiting Anesthetic complications: no   No complications documented.  Last Vitals:  Vitals:   07/29/20 0915 07/29/20 0930  BP: 109/64 112/68  Pulse: (!) 57 (!) 57  Resp: 16 17  Temp:    SpO2: 98% 99%    Last Pain:  Vitals:   07/29/20 0915  TempSrc:   PainSc: 0-No pain                 Barnet Glasgow

## 2020-07-29 NOTE — Discharge Instructions (Signed)
Managing Pregnancy Loss Pregnancy loss can happen any time during a pregnancy. Often the cause is not known. It is rarely because of anything you did. Pregnancy loss in early pregnancy (during the first trimester) is called a miscarriage. This type of pregnancy loss is the most common. Pregnancy loss that happens after 20 weeks of pregnancy is called fetal demise if the baby's heart stops beating before birth. Fetal demise is much less common. Some women experience spontaneous labor shortly after fetal demise resulting in a stillborn birth (stillbirth). Any pregnancy loss can be devastating. You will need to recover both physically and emotionally. Most women are able to get pregnant again after a pregnancy loss and deliver a healthy baby. How to manage emotional recovery  Pregnancy loss is very hard emotionally. You may feel many different emotions while you grieve. You may feel sad and angry. You may also feel guilty. It is normal to have periods of crying. Emotional recovery can take longer than physical recovery. It is different for everyone. Taking these steps can help you in managing this loss:  Remember that it is unlikely you did anything to cause the pregnancy loss.  Share your thoughts and feelings with friends, family, and your partner. Remember that your partner is also recovering emotionally.  Make sure you have a good support system. Do not spend too much time alone.  Meet with a pregnancy loss counselor or join a pregnancy loss support group.  Get enough sleep and eat a healthy diet. Return to regular exercise when you have recovered physically.  Do not use drugs or alcohol to manage your emotions.  Consider seeing a mental health professional to help you recover emotionally.  Ask a friend or loved one to help you decide what to do with any clothing and nursery items you received for your baby. In the case of a stillbirth, many women benefit from taking additional steps in the  grieving process. You may want to:  Hold your baby after the birth.  Name your baby.  Request a birth certificate.  Create a keepsake such as handprints or footprints.  Dress your baby and have a picture taken.  Make funeral arrangements.  Ask for a baptism or blessing. Hospitals have staff members who can help you with all these arrangements. How to recognize emotional stress It is normal to have emotional stress after a pregnancy loss. But emotional stress that lasts a long time or becomes severe requires treatment. Watch out for these signs of severe emotional stress:  Sadness, anger, or guilt that is not going away and is interfering with your normal activities.  Relationship problems that have occurred or gotten worse since the pregnancy loss.  Signs of depression that last longer than 2 weeks. These may include: ? Sadness. ? Anxiety. ? Hopelessness. ? Loss of interest in activities you enjoy. ? Inability to concentrate. ? Trouble sleeping or sleeping too much. ? Loss of appetite or overeating. ? Thoughts of death or of hurting yourself. Follow these instructions at home:  Take over-the-counter and prescription medicines only as told by your health care provider.  Rest at home until your energy level returns. Return to your normal activities as told by your health care provider. Ask your health care provider what activities are safe for you.  When you are ready, meet with your health care provider to discuss steps to take for a future pregnancy.  Keep all follow-up visits as told by your health care provider. This is important.  Where to find support  To help you and your partner with the process of grieving, talk with your health care provider or seek counseling.  Consider meeting with others who have experienced pregnancy loss. Ask your health care provider about support groups and resources. Where to find more information  [place] Department of Health and Passenger transport manager on Women's Health: http://hoffman.com/  American Pregnancy Association: www.americanpregnancy.org Contact a health care provider if:  You continue to experience grief, sadness, or lack of motivation for everyday activities, and those feelings do not improve over time.  You are struggling to recover emotionally, especially if you are using alcohol or substances to help. Get help right away if:  You have thoughts of hurting yourself or others. If you ever feel like you may hurt yourself or others, or have thoughts about taking your own life, get help right away. You can go to your nearest emergency department or call:  Your local emergency services (911 in the [place]  A suicide crisis helpline, such as the National Suicide Prevention Lifeline at 857 780 2540. This is open 24 hours a day. Summary  Any pregnancy loss can be difficult physically and emotionally.  You may experience many different emotions while you grieve. Emotional recovery can last longer than physical recovery.  It is normal to have emotional stress after a pregnancy loss. But emotional stress that lasts a long time or becomes severe requires treatment.  See your health care provider if you are struggling emotionally after a pregnancy loss. This information is not intended to replace advice given to you by your health care provider. Make sure you discuss any questions you have with your health care provider. Document Revised: 12/03/2018 Document Reviewed: 10/24/2017 Elsevier Patient Education  2020 [employer].    Post Anesthesia Home Care Instructions  Activity: Get plenty of rest for the remainder of the day. A responsible individual must stay with you for 24 hours following the procedure.  For the next 24 hours, DO NOT: -Drive a car -Advertising copywriter -Drink alcoholic beverages -Take any medication unless instructed by your physician -Make any legal decisions or sign important  papers.  Meals: Start with liquid foods such as gelatin or soup. Progress to regular foods as tolerated. Avoid greasy, spicy, heavy foods. If nausea and/or vomiting occur, drink only clear liquids until the nausea and/or vomiting subsides. Call your physician if vomiting continues.  Special Instructions/Symptoms: Your throat may feel dry or sore from the anesthesia or the breathing tube placed in your throat during surgery. If this causes discomfort, gargle with warm salt water. The discomfort should disappear within 24 hours.  If you had a scopolamine patch placed behind your ear for the management of post- operative nausea and/or vomiting:  1. The medication in the patch is effective for 72 hours, after which it should be removed.  Wrap patch in a tissue and discard in the trash. Wash hands thoroughly with soap and water. 2. You may remove the patch earlier than 72 hours if you experience unpleasant side effects which may include dry mouth, dizziness or visual disturbances. 3. Avoid touching the patch. Wash your hands with soap and water after contact with the patch.

## 2020-07-29 NOTE — Transfer of Care (Signed)
Immediate Anesthesia Transfer of Care Note  Patient: Tamara Foster  Procedure(s) Performed: Procedure(s) (LRB): DILATATION AND EVACUATION (N/A)  Patient Location: PACU  Anesthesia Type: MAC  Level of Consciousness: awake, alert , oriented and patient cooperative  Airway & Oxygen Therapy: Patient Spontanous Breathing and Patient connected to face mask oxygen  Post-op Assessment: Report given to PACU RN and Post -op Vital signs reviewed and stable  Post vital signs: Reviewed and stable  Complications: No apparent anesthesia complications  Last Vitals:  Vitals Value Taken Time  BP 110/70 07/29/20 0752  Temp    Pulse 73 07/29/20 0754  Resp 20 07/29/20 0754  SpO2 99 % 07/29/20 0754  Vitals shown include unvalidated device data.  Last Pain:  Vitals:   07/29/20 6734  TempSrc: Oral  PainSc: 7       Patients Stated Pain Goal: 4 (19/37/90 2409)  Complications: No complications documented.

## 2020-07-29 NOTE — Op Note (Signed)
Tamara Foster, [facility] MEDICAL RECORD [id number] ACCOUNT [id number] DATE OF BIRTH:08-06-1991 FACILITY: [facility] LOCATION: [facility] PHYSICIAN:Madex Seals BOVARD-STUCKERT, MD  OPERATIVE REPORT  DATE OF PROCEDURE:  07/29/2020  PREOPERATIVE DIAGNOSIS:  Retained products of conception despite Cytotec induction after blighted ovum.  POSTOPERATIVE DIAGNOSIS:  Retained products of conception despite Cytotec induction after blighted ovum.  PROCEDURE:  Suction D and C.  SURGEON:  Janyth Contes, MD  ANESTHESIA:  Local and MAC.  INTRAVENOUS FLUIDS:  Per anesthesia.  URINE OUTPUT:  Per anesthesia.  ESTIMATED BLOOD LOSS:  Approximately 10 mL.  COMPLICATIONS:  None.  PATHOLOGY:  Products of conception to pathology.  DESCRIPTION OF PROCEDURE:  After informed consent was reviewed with the patient including risks, benefits and alternatives of the surgical procedure, she was transported to the operating room and placed on the table in supine position.  MAC anesthesia  was induced and found to be adequate.  She was then placed in the [place] and prepped and draped in the normal sterile fashion.  Her bladder was drained sterilely.  After an appropriate timeout was performed, an open-sided speculum was placed  carefully visualizing her cervix.  A paracervical block was placed with 15 mL of 1 percent Nesacaine.  A single-tooth tenaculum was placed on the anterior lip of her cervix.  Her cervix was dilated to accommodate an 8-French suction curette without  difficulty.  Her uterus was mildly retroverted with 3 passes.  Minimal amount of tissue was obtained, which was consistent with what was seen on the ultrasound.  Her bleeding was controlled.  The instruments were removed from her vagina.  She was  returned to the supine position, awakened in stable condition.  Sponge, lap and needle counts were correct x2 and she was transported to the PACU following the procedure.  HN/NUANCE  D:07/29/2020  T:07/29/2020 JOB:[id number]

## 2020-07-30 LAB — RH IG WORKUP (INCLUDES ABO/RH)
ABO/RH(D): B NEG
Antibody Screen: NEGATIVE
Gestational Age(Wks): 8
Unit division: 0
Unit tag comment: 8

## 2020-08-01 ENCOUNTER — Encounter (HOSPITAL_BASED_OUTPATIENT_CLINIC_OR_DEPARTMENT_OTHER): Payer: Self-pay | Admitting: Obstetrics and Gynecology

## 2020-08-01 LAB — SURGICAL PATHOLOGY

## 2020-08-23 ENCOUNTER — Ambulatory Visit: Payer: BC Managed Care – PPO

## 2020-09-03 ENCOUNTER — Ambulatory Visit: Payer: BC Managed Care – PPO

## 2021-05-09 LAB — OB RESULTS CONSOLE RPR: RPR: NONREACTIVE

## 2021-05-09 LAB — OB RESULTS CONSOLE HIV ANTIBODY (ROUTINE TESTING): HIV: NONREACTIVE

## 2021-05-09 LAB — OB RESULTS CONSOLE GC/CHLAMYDIA
Chlamydia: NEGATIVE
Gonorrhea: NEGATIVE

## 2021-05-09 LAB — OB RESULTS CONSOLE HEPATITIS B SURFACE ANTIGEN: Hepatitis B Surface Ag: NEGATIVE

## 2021-05-09 LAB — OB RESULTS CONSOLE ABO/RH: RH Type: NEGATIVE

## 2021-05-09 LAB — OB RESULTS CONSOLE ANTIBODY SCREEN: Antibody Screen: NEGATIVE

## 2021-05-09 LAB — OB RESULTS CONSOLE RUBELLA ANTIBODY, IGM: Rubella: IMMUNE

## 2021-05-09 LAB — HEPATITIS C ANTIBODY: HCV Ab: NEGATIVE

## 2021-06-08 ENCOUNTER — Encounter (HOSPITAL_COMMUNITY): Payer: Self-pay | Admitting: Emergency Medicine

## 2021-06-08 ENCOUNTER — Ambulatory Visit (HOSPITAL_COMMUNITY)
Admission: EM | Admit: 2021-06-08 | Discharge: 2021-06-08 | Disposition: A | Discharge status: Home / Self Care | Payer: BC Managed Care – PPO

## 2021-06-08 ENCOUNTER — Other Ambulatory Visit: Payer: Self-pay

## 2021-06-08 DIAGNOSIS — Z751 Person awaiting admission to adequate facility elsewhere: Secondary | ICD-10-CM | POA: Diagnosis not present

## 2021-06-08 DIAGNOSIS — Z3A12 12 weeks gestation of pregnancy: Secondary | ICD-10-CM | POA: Diagnosis not present

## 2021-06-08 DIAGNOSIS — W19XXXA Unspecified fall, initial encounter: Secondary | ICD-10-CM | POA: Diagnosis not present

## 2021-06-08 NOTE — ED Notes (Signed)
Patient in lobby.  Family member with patient.  Reportedly, patient fell on a wet floor today.  Patient reports left arm is sore.  Patient extended left arm completely, holding phone in left hand .  Denies abdominal pain.  Denies vaginal discharge, denies any blood or liquid of any kind from vagina. Patient is 3 months pregnant.  Notified karen sophia, pa .  [facility] to be [facility] at [facility].  Notified patient and family member of this decision.

## 2021-06-08 NOTE — Discharge Instructions (Signed)
Please go to the maternity admissions unit for further evaluation of your pregnancy

## 2021-06-08 NOTE — ED Notes (Signed)
Patient is being discharged from the Urgent Care and sent to the Emergency Department via POV . Per A White, NP, patient is in need of higher level of care due to concern for baby after fall at work earlier today. Patient is aware and verbalizes understanding of plan of care.  Vitals:   06/08/21 1835  BP: 113/76  Pulse: 75  Resp: 16  Temp: 99.1 F (37.3 C)  SpO2: 97%

## 2021-06-08 NOTE — ED Triage Notes (Signed)
Pt presents with left arm pain after slipping and falling at working today.

## 2021-06-08 NOTE — ED Provider Notes (Signed)
[facility]    CSN: [id number] Arrival date & time: 06/08/21  1718      History   Chief Complaint Chief Complaint  Patient presents with   Arm Injury    left   Fall    HPI Tamara Foster is a 30 y.o. female.   Patient presents requesting evaluation of her baby, currently [redacted] weeks pregnant after fall occurring today.  Endorses that she landed on her buttocks.  Denies abdominal pain, vaginal bleeding or leaking fluid.  Endorses that she is not concerned about her arm at all.  History of miscarriage with retained products.  Receiving prenatal care from [facility].  Past Medical History:  Diagnosis Date   Eczema    Miscarriage    Retained products of conception after miscarriage 07/29/2020   S/P D&C (status post dilation and curettage) 07/29/2020    Patient Active Problem List   Diagnosis Date Noted   Retained products of conception after miscarriage 07/29/2020   S/P D&C (status post dilation and curettage) 07/29/2020    Past Surgical History:  Procedure Laterality Date   DILATION AND EVACUATION N/A 07/29/2020   Procedure: DILATATION AND EVACUATION;  Surgeon: Sherian Rein, MD;  Location: [facility];  Service: Gynecology;  Laterality: N/A;   NO PAST SURGERIES     WISDOM TOOTH EXTRACTION      OB History     Gravida  2   Para  1   Term  1   Preterm      AB      Living  1      SAB      IAB      Ectopic      Multiple      Live Births  1            Home Medications    Prior to Admission medications   Medication Sig Start Date End Date Taking? Authorizing Provider  acetaminophen (TYLENOL) 500 MG tablet Take 500 mg by mouth as needed for headache.    [provider]  ibuprofen (ADVIL) 600 MG tablet Take 1 tablet (600 mg total) by mouth every 6 (six) hours as needed for moderate pain. 07/29/20   Bovard-Stuckert, Augusto Gamble, MD  Prenatal Vit-Fe Fumarate-FA (PRENATAL VITAMIN) 27-0.8 MG TABS Prenatal Vitamin     [provider]    Family History Family History  Problem Relation Age of Onset   Cancer Father        bone   Diabetes Father    Cancer Paternal Aunt        breast    Social History Social History   Tobacco Use   Smoking status: Never   Smokeless tobacco: Never  Vaping Use   Vaping Use: Never used  Substance Use Topics   Alcohol use: No   Drug use: Never     Allergies   Patient has no known allergies.   Review of Systems Review of Systems   Physical Exam Triage Vital Signs ED Triage Vitals  Enc Vitals Group     BP 06/08/21 1835 113/76     Pulse Rate 06/08/21 1835 75     Resp 06/08/21 1835 16     Temp 06/08/21 1835 99.1 F (37.3 C)     Temp Source 06/08/21 1835 Oral     SpO2 06/08/21 1835 97 %     Weight --      Height --      Head Circumference --  Peak Flow --      Pain Score 06/08/21 1834 6     Pain Loc --      Pain Edu? --      Excl. in GC? --    No data found.  Updated Vital Signs BP 113/76 (BP Location: Right Arm)   Pulse 75   Temp 99.1 F (37.3 C) (Oral)   Resp 16   SpO2 97%   Visual Acuity Right Eye Distance:   Left Eye Distance:   Bilateral Distance:    Right Eye Near:   Left Eye Near:    Bilateral Near:     Physical Exam   UC Treatments / Results  Labs (all labs ordered are listed, but only abnormal results are displayed) Labs Reviewed - No data to display  EKG   Radiology No results found.  Procedures Procedures (including critical care time)  Medications Ordered in UC Medications - No data to display  Initial Impression / Assessment and Plan / UC Course  I have reviewed the triage vital signs and the nursing notes.  Pertinent labs & imaging results that were available during my care of the patient were reviewed by me and considered in my medical decision making (see chart for details).  Fall, initial encounter [redacted] weeks gestation of pregnancy  Discussed with patient capabilities of assessing  fetus in urgent care setting, advised patient to go to maternity admissions unit for further evaluation Final Clinical Impressions(s) / UC Diagnoses   Final diagnoses:  Fall, initial encounter  [redacted] weeks gestation of pregnancy     Discharge Instructions      Please go to the maternity admissions unit for further evaluation of your pregnancy   ED Prescriptions   None    PDMP not reviewed this encounter.   Valinda Hoar, NP 06/08/21 1928

## 2021-08-27 NOTE — L&D Delivery Note (Signed)
Delivery Note ? ? ?Pt rapidly progressed to complete dilation and pushed well for 5 minutes. At 12:50 PM a healthy female was delivered via  (Presentation: Left Occiput Anterior).  APGAR: 8, 9; weight  pending.   ?Placenta status: Spontaneous, Intact.  Cord: 3 vessels with the following complications: None.  ? ?Anesthesia: Epidural ?Episiotomy: None ?Lacerations: Abrasions ?Suture Repair:  N/A ?Est. Blood Loss (mL): 75 ? ?Mom to postpartum.  Baby to Couplet care / Skin to Skin. ? ?D/w parents circumcision and they desire to proceed. ? ?Tamara Foster ?12/06/2021, 1:15 PM ? ? ? ?

## 2021-11-14 LAB — OB RESULTS CONSOLE GBS: GBS: POSITIVE

## 2021-11-23 ENCOUNTER — Telehealth (HOSPITAL_COMMUNITY): Payer: Self-pay | Admitting: *Deleted

## 2021-11-23 ENCOUNTER — Encounter (HOSPITAL_COMMUNITY): Payer: Self-pay | Admitting: *Deleted

## 2021-11-23 NOTE — Telephone Encounter (Signed)
Preadmission screen  

## 2021-11-24 ENCOUNTER — Encounter (HOSPITAL_COMMUNITY): Payer: Self-pay | Admitting: *Deleted

## 2021-11-24 ENCOUNTER — Telehealth (HOSPITAL_COMMUNITY): Payer: Self-pay | Admitting: *Deleted

## 2021-11-24 NOTE — Telephone Encounter (Signed)
Preadmission screen  

## 2021-12-05 ENCOUNTER — Other Ambulatory Visit: Payer: Self-pay | Admitting: Obstetrics and Gynecology

## 2021-12-05 NOTE — H&P (Deleted)
  The note originally documented on this encounter has been moved the the encounter in which it belongs.  

## 2021-12-05 NOTE — H&P (Signed)
Tamara Foster is a 31 y.o. female G2P1001 at 43 2/7 weeks (EDD 12/10/2021 by LMP c/w 9 week [place]) presenting for elective IOL at term.  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 She is also Rh negative.  ? ?OB History   ? ? Gravida  ?2  ? Para  ?1  ? Term  ?1  ? Preterm  ?   ? AB  ?   ? Living  ?1  ?  ? ? SAB  ?   ? IAB  ?   ? Ectopic  ?   ? Multiple  ?   ? Live Births  ?1  ?   ?  ?  ?12-20-2012, 40.2 wks ?1. F, 7lbs 13oz, NSVD  ? ?Past Medical History:  ?Diagnosis Date  ? Eczema   ? Miscarriage   ? Retained products of conception after miscarriage 07/29/2020  ? S/P D&C (status post dilation and curettage) 07/29/2020  ? ?Past Surgical History:  ?Procedure Laterality Date  ? DILATION AND EVACUATION N/A 07/29/2020  ? Procedure: DILATATION AND EVACUATION;  Surgeon: Sherian Rein, MD;  Location: [facility];  Service: Gynecology;  Laterality: N/A;  ? NO PAST SURGERIES    ? WISDOM TOOTH EXTRACTION    ? ?Family History: family history includes Cancer in her father and paternal aunt; Diabetes in her father. ?Social History:  reports that she has never smoked. She has never used smokeless tobacco. She reports that she does not drink alcohol and does not use drugs. ? ? ?  ?Maternal Diabetes: No ?Genetic Screening: Normal ?Maternal Ultrasounds/Referrals: Normal ?Fetal Ultrasounds or other Referrals:  None ?Maternal Substance Abuse:  No ?Significant Maternal Medications:  Meds include: Other:   Valtrex ?Significant Maternal Lab Results:  Group B Strep positive and Rh negative ?Other Comments:  None ? ?Review of Systems  ?Constitutional:  Negative for fever.  ?Gastrointestinal:  Negative for abdominal pain.  ?Genitourinary:  Negative for pelvic pain.  ?Maternal Medical History:  ?Contractions: Frequency: irregular.   ?Perceived severity is mild.   ?Fetal activity: Perceived fetal activity is normal.   ?Prenatal complications: +GBS, +h/o HSV ?Prenatal Complications -  Diabetes: none. ? ?  ?There were no vitals taken for this visit. ?Maternal Exam:  ?Uterine Assessment: Contraction strength is mild.  Contraction frequency is irregular.  ?Abdomen: Patient reports no abdominal tenderness. Fetal presentation: vertex ?Introitus: Normal vulva. Normal vagina.   ?Physical Exam ?Cardiovascular:  ?   Rate and Rhythm: Normal rate and regular rhythm.  ?Pulmonary:  ?   Effort: Pulmonary effort is normal.  ?Abdominal:  ?   Palpations: Abdomen is soft.  ?Genitourinary: ?   General: Normal vulva.  ?Neurological:  ?   Mental Status: She is alert.  ?Psychiatric:     ?   Mood and Affect: Mood normal.  ?  ?Prenatal labs: ?ABO, Rh: B/Negative/-- (09/13 0000) ?Antibody: Negative (09/13 0000) ?Rubella: Immune (09/13 0000) ?RPR: Nonreactive (09/13 0000)  ?HBsAg: Negative (09/13 0000)  ?HIV: Non-reactive (09/13 0000)  ?GBS: Positive/-- (03/21 0000)  ?NIPT low risk ?One hour GCT 122 ?Hgb AA ? ?Assessment/Plan: ?Pt admitted for ripening and IOL at term. S/p cytotec x 2 and good response, contractions uncomfortable and cervix 2-3cm.  Will receive epidural and then we will perform AROM.  FHR category 1. Has received PCN for GBS prophylaxis.    ? ?Oliver Pila ?12/05/2021, 5:46 PM ? ? ? ? ?

## 2021-12-06 ENCOUNTER — Inpatient Hospital Stay (HOSPITAL_COMMUNITY): Payer: BC Managed Care – PPO

## 2021-12-06 ENCOUNTER — Inpatient Hospital Stay (HOSPITAL_COMMUNITY): Payer: BC Managed Care – PPO | Admitting: Anesthesiology

## 2021-12-06 ENCOUNTER — Inpatient Hospital Stay (HOSPITAL_COMMUNITY)
Admission: AD | Admit: 2021-12-06 | Discharge: 2021-12-08 | DRG: 807 | Disposition: A | Discharge status: Home / Self Care | Payer: BC Managed Care – PPO | Attending: Obstetrics and Gynecology | Admitting: Obstetrics and Gynecology

## 2021-12-06 ENCOUNTER — Encounter (HOSPITAL_COMMUNITY): Payer: Self-pay | Admitting: Obstetrics and Gynecology

## 2021-12-06 ENCOUNTER — Other Ambulatory Visit: Payer: Self-pay

## 2021-12-06 DIAGNOSIS — A6000 Herpesviral infection of urogenital system, unspecified: Secondary | ICD-10-CM | POA: Diagnosis present

## 2021-12-06 DIAGNOSIS — O26893 Other specified pregnancy related conditions, third trimester: Secondary | ICD-10-CM | POA: Diagnosis present

## 2021-12-06 DIAGNOSIS — O9832 Other infections with a predominantly sexual mode of transmission complicating childbirth: Secondary | ICD-10-CM | POA: Diagnosis present

## 2021-12-06 DIAGNOSIS — O99824 Streptococcus B carrier state complicating childbirth: Secondary | ICD-10-CM | POA: Diagnosis present

## 2021-12-06 DIAGNOSIS — Z3A39 39 weeks gestation of pregnancy: Secondary | ICD-10-CM

## 2021-12-06 DIAGNOSIS — Z6791 Unspecified blood type, Rh negative: Secondary | ICD-10-CM | POA: Diagnosis not present

## 2021-12-06 LAB — CBC
HCT: 33.9 % — ABNORMAL LOW (ref 36.0–46.0)
Hemoglobin: 10.9 g/dL — ABNORMAL LOW (ref 12.0–15.0)
MCH: 26.1 pg (ref 26.0–34.0)
MCHC: 32.2 g/dL (ref 30.0–36.0)
MCV: 81.1 fL (ref 80.0–100.0)
Platelets: 194 10*3/uL (ref 150–400)
RBC: 4.18 MIL/uL (ref 3.87–5.11)
RDW: 14.8 % (ref 11.5–15.5)
WBC: 8.1 10*3/uL (ref 4.0–10.5)
nRBC: 0 % (ref 0.0–0.2)

## 2021-12-06 LAB — TYPE AND SCREEN
ABO/RH(D): B NEG
Antibody Screen: NEGATIVE

## 2021-12-06 LAB — RPR: RPR Ser Ql: NONREACTIVE

## 2021-12-06 MED ORDER — LIDOCAINE HCL (PF) 1 % IJ SOLN
INTRAMUSCULAR | Status: DC | PRN
  Administered 2021-12-06: 8 mL via EPIDURAL

## 2021-12-06 MED ORDER — FENTANYL-BUPIVACAINE-NACL 0.5-0.125-0.9 MG/250ML-% EP SOLN
12.0000 mL/h | EPIDURAL | Status: DC | PRN
  Administered 2021-12-06: 12 mL/h via EPIDURAL

## 2021-12-06 MED ORDER — BUTORPHANOL TARTRATE 1 MG/ML IJ SOLN: 1.0000 mg | INTRAMUSCULAR | Status: DC | PRN

## 2021-12-06 MED ORDER — TERBUTALINE SULFATE 1 MG/ML IJ SOLN: 0.2500 mg | Freq: Once | INTRAMUSCULAR | Status: DC | PRN

## 2021-12-06 MED ORDER — EPHEDRINE 5 MG/ML INJ: 10.0000 mg | INTRAVENOUS | Status: DC | PRN

## 2021-12-06 MED ORDER — SOD CITRATE-CITRIC ACID 500-334 MG/5ML PO SOLN: 30.0000 mL | ORAL | Status: DC | PRN

## 2021-12-06 MED ORDER — OXYCODONE-ACETAMINOPHEN 5-325 MG PO TABS: 2.0000 | ORAL_TABLET | ORAL | Status: DC | PRN

## 2021-12-06 MED ORDER — SENNOSIDES-DOCUSATE SODIUM 8.6-50 MG PO TABS
2.0000 | ORAL_TABLET | ORAL | Status: DC
  Administered 2021-12-07 – 2021-12-08 (×2): 2 via ORAL
  Filled 2021-12-06 (×2): qty 2

## 2021-12-06 MED ORDER — WITCH HAZEL-GLYCERIN EX PADS: 1.0000 "application " | MEDICATED_PAD | CUTANEOUS | Status: DC | PRN

## 2021-12-06 MED ORDER — OXYTOCIN-SODIUM CHLORIDE 30-0.9 UT/500ML-% IV SOLN
1.0000 m[IU]/min | INTRAVENOUS | Status: DC
  Filled 2021-12-06: qty 500

## 2021-12-06 MED ORDER — BENZOCAINE-MENTHOL 20-0.5 % EX AERO
1.0000 "application " | INHALATION_SPRAY | CUTANEOUS | Status: DC | PRN
  Administered 2021-12-08: 1 via TOPICAL
  Filled 2021-12-06: qty 56

## 2021-12-06 MED ORDER — ACETAMINOPHEN 325 MG PO TABS
650.0000 mg | ORAL_TABLET | ORAL | Status: DC | PRN
  Administered 2021-12-07 – 2021-12-08 (×5): 650 mg via ORAL
  Filled 2021-12-06 (×5): qty 2

## 2021-12-06 MED ORDER — ONDANSETRON HCL 4 MG PO TABS: 4.0000 mg | ORAL_TABLET | ORAL | Status: DC | PRN

## 2021-12-06 MED ORDER — COCONUT OIL OIL: 1.0000 "application " | TOPICAL_OIL | Status: DC | PRN

## 2021-12-06 MED ORDER — PENICILLIN G POT IN DEXTROSE 60000 UNIT/ML IV SOLN
3.0000 10*6.[IU] | INTRAVENOUS | Status: DC
  Administered 2021-12-06 (×2): 3 10*6.[IU] via INTRAVENOUS
  Filled 2021-12-06 (×2): qty 50

## 2021-12-06 MED ORDER — ZOLPIDEM TARTRATE 5 MG PO TABS: 5.0000 mg | ORAL_TABLET | Freq: Every evening | ORAL | Status: DC | PRN

## 2021-12-06 MED ORDER — FENTANYL-BUPIVACAINE-NACL 0.5-0.125-0.9 MG/250ML-% EP SOLN
EPIDURAL | Status: AC
  Filled 2021-12-06: qty 250

## 2021-12-06 MED ORDER — TETANUS-DIPHTH-ACELL PERTUSSIS 5-2.5-18.5 LF-MCG/0.5 IM SUSY: 0.5000 mL | PREFILLED_SYRINGE | Freq: Once | INTRAMUSCULAR | Status: DC

## 2021-12-06 MED ORDER — LIDOCAINE HCL (PF) 1 % IJ SOLN: 30.0000 mL | INTRAMUSCULAR | Status: DC | PRN

## 2021-12-06 MED ORDER — SODIUM CHLORIDE 0.9 % IV SOLN
5.0000 10*6.[IU] | Freq: Once | INTRAVENOUS | Status: AC
  Administered 2021-12-06: 5 10*6.[IU] via INTRAVENOUS
  Filled 2021-12-06: qty 5

## 2021-12-06 MED ORDER — DIPHENHYDRAMINE HCL 50 MG/ML IJ SOLN: 12.5000 mg | INTRAMUSCULAR | Status: DC | PRN

## 2021-12-06 MED ORDER — IBUPROFEN 600 MG PO TABS
600.0000 mg | ORAL_TABLET | Freq: Four times a day (QID) | ORAL | Status: DC
  Administered 2021-12-06 – 2021-12-08 (×8): 600 mg via ORAL
  Filled 2021-12-06 (×8): qty 1

## 2021-12-06 MED ORDER — ACETAMINOPHEN 325 MG PO TABS: 650.0000 mg | ORAL_TABLET | ORAL | Status: DC | PRN

## 2021-12-06 MED ORDER — PHENYLEPHRINE 40 MCG/ML (10ML) SYRINGE FOR IV PUSH (FOR BLOOD PRESSURE SUPPORT): 80.0000 ug | PREFILLED_SYRINGE | INTRAVENOUS | Status: DC | PRN

## 2021-12-06 MED ORDER — ONDANSETRON HCL 4 MG/2ML IJ SOLN: 4.0000 mg | Freq: Four times a day (QID) | INTRAMUSCULAR | Status: DC | PRN

## 2021-12-06 MED ORDER — SIMETHICONE 80 MG PO CHEW: 80.0000 mg | CHEWABLE_TABLET | ORAL | Status: DC | PRN

## 2021-12-06 MED ORDER — MISOPROSTOL 25 MCG QUARTER TABLET
25.0000 ug | ORAL_TABLET | ORAL | Status: AC | PRN
  Administered 2021-12-06 (×2): 25 ug via VAGINAL
  Filled 2021-12-06 (×2): qty 1

## 2021-12-06 MED ORDER — ONDANSETRON HCL 4 MG/2ML IJ SOLN
4.0000 mg | INTRAMUSCULAR | Status: DC | PRN
Start: 2021-12-06 — End: 2021-12-08

## 2021-12-06 MED ORDER — OXYCODONE-ACETAMINOPHEN 5-325 MG PO TABS: 1.0000 | ORAL_TABLET | ORAL | Status: DC | PRN

## 2021-12-06 MED ORDER — PRENATAL MULTIVITAMIN CH
1.0000 | ORAL_TABLET | Freq: Every day | ORAL | Status: DC
  Administered 2021-12-07 – 2021-12-08 (×2): 1 via ORAL
  Filled 2021-12-06 (×2): qty 1

## 2021-12-06 MED ORDER — DIBUCAINE (PERIANAL) 1 % EX OINT: 1.0000 "application " | TOPICAL_OINTMENT | CUTANEOUS | Status: DC | PRN

## 2021-12-06 MED ORDER — OXYTOCIN-SODIUM CHLORIDE 30-0.9 UT/500ML-% IV SOLN
2.5000 [IU]/h | INTRAVENOUS | Status: DC
  Administered 2021-12-06: 2.5 [IU]/h via INTRAVENOUS

## 2021-12-06 MED ORDER — OXYTOCIN BOLUS FROM INFUSION
333.0000 mL | Freq: Once | INTRAVENOUS | Status: AC
  Administered 2021-12-06: 333 mL via INTRAVENOUS

## 2021-12-06 MED ORDER — LACTATED RINGERS IV SOLN
500.0000 mL | Freq: Once | INTRAVENOUS | Status: AC
  Administered 2021-12-06: 500 mL via INTRAVENOUS

## 2021-12-06 MED ORDER — LACTATED RINGERS IV SOLN: INTRAVENOUS | Status: DC

## 2021-12-06 MED ORDER — DIPHENHYDRAMINE HCL 25 MG PO CAPS
25.0000 mg | ORAL_CAPSULE | Freq: Four times a day (QID) | ORAL | Status: DC | PRN
  Administered 2021-12-08: 25 mg via ORAL
  Filled 2021-12-06: qty 1

## 2021-12-06 MED ORDER — LACTATED RINGERS IV SOLN: 500.0000 mL | INTRAVENOUS | Status: DC | PRN

## 2021-12-06 NOTE — Progress Notes (Signed)
Patient ID: Tamara Foster, female   DOB: [birth year], 31 y.o.   MRN: [id number] ?Received epidural and comfortable ? ?FHR category 1 ? ?Cervix 90/4/-1 ?AROM clear ? ?Follow progess ?Augment with pitocin if needed ?

## 2021-12-06 NOTE — Anesthesia Preprocedure Evaluation (Signed)
Anesthesia Evaluation  Patient identified by MRN, date of birth, ID band Patient awake    Reviewed: Allergy & Precautions, NPO status , Patient's Chart, lab work & pertinent test results  Airway Mallampati: II  TM Distance: >3 FB Neck ROM: Full    Dental no notable dental hx.    Pulmonary neg pulmonary ROS,    Pulmonary exam normal breath sounds clear to auscultation       Cardiovascular negative cardio ROS Normal cardiovascular exam Rhythm:Regular Rate:Normal     Neuro/Psych negative neurological ROS  negative psych ROS   GI/Hepatic negative GI ROS, Neg liver ROS,   Endo/Other  negative endocrine ROS  Renal/GU negative Renal ROS  negative genitourinary   Musculoskeletal negative musculoskeletal ROS (+)   Abdominal   Peds negative pediatric ROS (+)  Hematology  (+) Blood dyscrasia, anemia ,   Anesthesia Other Findings   Reproductive/Obstetrics (+) Pregnancy                             Anesthesia Physical Anesthesia Plan  ASA: 2  Anesthesia Plan: Epidural   Post-op Pain Management:    Induction:   PONV Risk Score and Plan: 2 and Treatment may vary due to age or medical condition  Airway Management Planned: Natural Airway  Additional Equipment: None  Intra-op Plan:   Post-operative Plan:   Informed Consent: I have reviewed the patients History and Physical, chart, labs and discussed the procedure including the risks, benefits and alternatives for the proposed anesthesia with the patient or authorized representative who has indicated his/her understanding and acceptance.       Plan Discussed with: Anesthesiologist  Anesthesia Plan Comments:         Anesthesia Quick Evaluation  

## 2021-12-06 NOTE — Anesthesia Procedure Notes (Signed)
Epidural ?Patient location during procedure: OB ?Start time: 12/06/2021 8:42 AM ?End time: 12/06/2021 8:52 AM ? ?Staffing ?Anesthesiologist: Mellody Dance, MD ?Performed: anesthesiologist  ? ?Preanesthetic Checklist ?Completed: patient identified, IV checked, site marked, risks and benefits discussed, monitors and equipment checked, pre-op evaluation and timeout performed ? ?Epidural ?Patient position: sitting ?Prep: DuraPrep ?Patient monitoring: heart rate, cardiac monitor, continuous pulse ox and blood pressure ?Approach: midline ?Location: L3-L4 ?Injection technique: LOR saline ? ?Needle:  ?Needle type: Tuohy  ?Needle gauge: 17 G ?Needle length: 9 cm ?Needle insertion depth: 8 cm ?Catheter type: closed end flexible ?Catheter size: 20 Guage ?Catheter at skin depth: 13 cm ?Test dose: negative and Other ? ?Assessment ?Events: blood not aspirated, injection not painful, no injection resistance and negative IV test ? ?Additional Notes ?Informed consent obtained prior to proceeding including risk of failure, 1% risk of PDPH, risk of minor discomfort and bruising.  Discussed rare but serious complications including epidural abscess, permanent nerve injury, epidural hematoma.  Discussed alternatives to epidural analgesia and patient desires to proceed.  Timeout performed pre-procedure verifying patient name, procedure, and platelet count.  Patient tolerated procedure well. ? ? ? ? ?

## 2021-12-06 NOTE — Lactation Note (Signed)
This note was copied from a baby's chart. ?Lactation Consultation Note ? ?Patient Name: Tamara Foster ?Today's Date: 12/06/2021 ?Reason for consult: L&D Initial assessment;Term;Breastfeeding assistance;Other (Comment) (LC LD visit @45  mins PP / Baby STS and per RN's had attempted to latch without success. LC offered and mom receptive, several attempts on and off 3-4 strong sucks and off. Baby STS on moms chest . mom aware she wil have F/U with LC today.)- Latch score 6  ?Age:31 mins  ? ?Maternal Data ?Does the patient have breastfeeding experience prior to this delivery?: Yes ?How long did the patient breastfeed?: per mom 1st baby was a difficult latch and ended up pumping and bottle feeding for 6 months ? ?Feeding ?Mother's Current Feeding Choice: Breast Milk and Formula ? ?LATCH Score ?Latch: Repeated attempts needed to sustain latch, nipple held in mouth throughout feeding, stimulation needed to elicit sucking reflex. ? ?Audible Swallowing: None ? ?Type of Nipple: Everted at rest and after stimulation (compressible areolas - able to sandwich tissue) ? ?Comfort (Breast/Nipple): Soft / non-tender ? ?Hold (Positioning): Assistance needed to correctly position infant at breast and maintain latch. ? ?LATCH Score: 6 ? ? ?Lactation Tools Discussed/Used ?  ? ?Interventions ?Interventions: Breast feeding basics reviewed;Reverse pressure;Education ? ?Discharge ?  ? ?Consult Status ?Consult Status: Follow-up from L&D ?Date: 12/06/21 ?Follow-up type: In-patient ? ? ? ?02/05/22 Xcaret Morad ?12/06/2021, 1:53 PM ? ? ? ?

## 2021-12-07 LAB — CBC
HCT: 32.3 % — ABNORMAL LOW (ref 36.0–46.0)
Hemoglobin: 10.4 g/dL — ABNORMAL LOW (ref 12.0–15.0)
MCH: 26 pg (ref 26.0–34.0)
MCHC: 32.2 g/dL (ref 30.0–36.0)
MCV: 80.8 fL (ref 80.0–100.0)
Platelets: 171 10*3/uL (ref 150–400)
RBC: 4 MIL/uL (ref 3.87–5.11)
RDW: 15 % (ref 11.5–15.5)
WBC: 8.8 10*3/uL (ref 4.0–10.5)
nRBC: 0 % (ref 0.0–0.2)

## 2021-12-07 MED ORDER — RHO D IMMUNE GLOBULIN 1500 UNIT/2ML IJ SOSY
300.0000 ug | PREFILLED_SYRINGE | Freq: Once | INTRAMUSCULAR | Status: AC
  Administered 2021-12-07: 300 ug via INTRAVENOUS
  Filled 2021-12-07: qty 2

## 2021-12-07 NOTE — Progress Notes (Signed)
Baby not feeding as much as they would like, defer circ to tomorrow.  Orders are in.  ?

## 2021-12-07 NOTE — Lactation Note (Signed)
This note was copied from a baby's chart. ?Lactation Consultation Note ? ?Patient Name: Tamara Foster ?Today's Date: 12/07/2021 ?Reason for consult: Follow-up assessment ?Age:31 hours ? ?P2, Mother pumped and bottle fed her first child for 6 mos.   ?Baby has been sleepy, spitty and has been supplemented with 5 ml of formula. ?Reviewed hand expression with drops expressed. ?Assisted with latching in both cross cradle and football hold with no sustained latch. ?Feed on demand with cues.  Goal 8-12+ times per day after first 24 hrs.  Place baby STS if not cueing.  ?Encouraged STS. ?Mom made aware of O/P services, breastfeeding support groups, and our phone # for post-discharge questions.  ? ? ? ?Maternal Data ?Has patient been taught Hand Expression?: Yes ? ?Feeding ?Mother's Current Feeding Choice: Breast Milk and Formula ?Nipple Type: Slow - flow ? ?Interventions ?Interventions: Breast feeding basics reviewed;Assisted with latch;Skin to skin;Hand express;Education;LC Services brochure ? ?Discharge ?Pump: Personal;DEBP ([facility]) ? ?Consult Status ?Consult Status: Follow-up ?Date: 12/08/21 ?Follow-up type: In-patient ? ? ? ?Dahlia Byes Boschen ?12/07/2021, 8:45 AM ? ? ? ?

## 2021-12-07 NOTE — Anesthesia Postprocedure Evaluation (Signed)
Anesthesia Post Note ? ?Patient: [name] ? ?Procedure(s) Performed: AN AD HOC LABOR EPIDURAL ? ?  ? ?Patient location during evaluation: Mother Baby ?Anesthesia Type: Epidural ?Level of consciousness: awake ?Pain management: satisfactory to patient ?Vital Signs Assessment: post-procedure vital signs reviewed and stable ?Respiratory status: spontaneous breathing ?Cardiovascular status: stable ?Anesthetic complications: no ? ? ?No notable events documented. ? ?Last Vitals:  ?Vitals:  ? 12/06/21 2352 12/07/21 0430  ?BP: 114/76 117/81  ?Pulse: 80 62  ?Resp: 16 18  ?Temp: 36.7 ?C 36.7 ?C  ?SpO2:    ?  ?Last Pain:  ?Vitals:  ? 12/07/21 0430  ?TempSrc: Oral  ?PainSc:   ? ?Pain Goal: Patients Stated Pain Goal: 0 (12/06/21 0935) ? ?  ?  ?  ?  ?  ?  ?  ? ?Tamara Foster ? ? ? ? ?

## 2021-12-07 NOTE — Progress Notes (Signed)
Patient is eating, ambulating, voiding.  Pain control is good. ? ?Vitals:  ? 12/06/21 1614 12/06/21 2032 12/06/21 2352 12/07/21 0430  ?BP: 118/77 111/68 114/76 117/81  ?Pulse: 86 85 80 62  ?Resp: 20 18 16 18   ?Temp: 98.4 ?F (36.9 ?C) 98.8 ?F (37.1 ?C) 98 ?F (36.7 ?C) 98 ?F (36.7 ?C)  ?TempSrc: Oral Oral Oral Oral  ?SpO2:      ?Weight:      ?Height:      ? ? ?Fundus firm ?Perineum without swelling. ? ?Lab Results  ?Component Value Date  ? WBC 8.8 12/07/2021  ? HGB 10.4 (L) 12/07/2021  ? HCT 32.3 (L) 12/07/2021  ? MCV 80.8 12/07/2021  ? PLT 171 12/07/2021  ? ? ?--/--/B NEG (04/12 0031)/RI ? ?A/P Post partum day 1. ?Rhogam - baby RH POS. ? Routine care.  Expect d/c routine.   Parents desires circumsision.  All risks, benefits and alternatives discussed with the mother.  ? ?12-02-1974 ?  ?

## 2021-12-07 NOTE — Lactation Note (Signed)
This note was copied from a baby's chart. ?Lactation Consultation Note ?Doesn't want to be seen by Lactation until am. ? ?Patient Name: Tamara Foster ?Today's Date: 12/07/2021 ?  ?Age:31 hours ? ?Maternal Data ?  ? ?Feeding ?  ? ?LATCH Score ?  ? ?  ? ?  ? ?  ? ?  ? ?  ? ? ?Lactation Tools Discussed/Used ?  ? ?Interventions ?  ? ?Discharge ?  ? ?Consult Status ?  ? ? ? ?Charyl Dancer ?12/07/2021, 1:27 AM ? ? ? ?

## 2021-12-08 LAB — RH IG WORKUP (INCLUDES ABO/RH)
Fetal Screen: NEGATIVE
Gestational Age(Wks): 39.3
Unit division: 0

## 2021-12-08 MED ORDER — IBUPROFEN 600 MG PO TABS: 600.0000 mg | ORAL_TABLET | Freq: Four times a day (QID) | ORAL | 1 refills | Status: AC | PRN

## 2021-12-08 NOTE — Discharge Summary (Signed)
? ?  Postpartum Discharge Summary ? ?Date of Service updated  ? ?   ?Patient Name: Tamara Foster ?DOB: 1991/04/12 ?MRN: [id number] ? ?Date of admission: 12/06/2021 ?Delivery date:12/06/2021  ?Delivering provider: Huel Cote  ?Date of discharge: 12/08/2021 ? ?Admitting diagnosis: Indication for care in labor and delivery, antepartum [O75.9] ?NSVD (normal spontaneous vaginal delivery) [O80] ?Intrauterine pregnancy: [redacted]w[redacted]d     ?Secondary diagnosis:  Principal Problem: ?  Indication for care in labor and delivery, antepartum ?Active Problems: ?  NSVD (normal spontaneous vaginal delivery) ? ?Additional problems: none    ?Discharge diagnosis: Term Pregnancy Delivered                                              ?Post partum procedures: n/a ?Augmentation: AROM and Cytotec ?Complications: None ? ?Hospital course: Induction of Labor With Vaginal Delivery   ?31 y.o. yo [id number] at [redacted]w[redacted]d was admitted to the hospital 12/06/2021 for induction of labor.  Indication for induction: Favorable cervix at term.  Patient had an uncomplicated labor course as follows: ?Membrane Rupture Time/Date: 10:22 AM ,12/06/2021   ?Delivery Method:Vaginal, Spontaneous  ?Episiotomy: None  ?Lacerations:  None  ?Details of delivery can be found in separate delivery note.  Patient had a routine postpartum course. Patient is discharged home 12/08/21. ? ?Newborn Data: ?Birth date:12/06/2021  ?Birth time:12:50 PM  ?Gender:Female  ?Living status:Living  ?Apgars:9 ,9  ?Weight:3320 g  ? ?Magnesium Sulfate received: No ?BMZ received: No ? ?Physical exam  ?Vitals:  ? 12/07/21 0430 12/07/21 1529 12/07/21 2027 12/08/21 0542  ?BP: 117/81 115/78 120/70 110/75  ?Pulse: 62 93 76 86  ?Resp: 18 17 18 18   ?Temp: 98 ?F (36.7 ?C) 98.6 ?F (37 ?C) 98.1 ?F (36.7 ?C) 98.6 ?F (37 ?C)  ?TempSrc: Oral Oral Oral Oral  ?SpO2:  100% 100%   ?Weight:      ?Height:      ? ?General: alert, cooperative, and no distress ?Lochia: appropriate ?Uterine Fundus: firm ?Incision: N/A ?DVT  Evaluation: No evidence of DVT seen on physical exam. ?Labs: ?Lab Results  ?Component Value Date  ? WBC 8.8 12/07/2021  ? HGB 10.4 (L) 12/07/2021  ? HCT 32.3 (L) 12/07/2021  ? MCV 80.8 12/07/2021  ? PLT 171 12/07/2021  ? ? ?  Latest Ref Rng & Units 02/08/2016  ?  4:32 PM  ?CMP  ?Glucose 65 - 99 mg/dL 76    ?BUN 6 - 20 mg/dL 15    ?Creatinine 0.44 - 1.00 mg/dL 02/10/2016    ?Sodium 135 - 145 mmol/L 142    ?Potassium 3.5 - 5.1 mmol/L 3.7    ?Chloride 101 - 111 mmol/L 103    ? ?Edinburgh Score: ? ?  12/07/2021  ?  3:46 PM  ?12/09/2021 Postnatal Depression Scale Screening Tool  ?I have been able to laugh and see the funny side of things. 0  ?I have looked forward with enjoyment to things. 0  ?I have blamed myself unnecessarily when things went wrong. 0  ?I have been anxious or worried for no good reason. 0  ?I have felt scared or panicky for no good reason. 0  ?Things have been getting on top of me. 0  ?I have been so unhappy that I have had difficulty sleeping. 0  ?I have felt sad or miserable. 0  ?I have been so unhappy that I have been crying.  0  ?The thought of harming myself has occurred to me. 0  ?Edinburgh Postnatal Depression Scale Total 0  ? ? ? ? ?After visit meds:  ?Allergies as of 12/08/2021   ?No Known Allergies ?  ? ?  ?Medication List  ?  ? ?STOP taking these medications   ? ?hydrOXYzine 25 MG capsule ?Commonly known as: VISTARIL ?  ? ?  ? ?TAKE these medications   ? ?acetaminophen 500 MG tablet ?Commonly known as: TYLENOL ?Take 500 mg by mouth as needed for headache. ?  ?cyclobenzaprine 10 MG tablet ?Commonly known as: FLEXERIL ?Take 10 mg by mouth 3 (three) times daily. ?  ?ibuprofen 600 MG tablet ?Commonly known as: ADVIL ?Take 1 tablet (600 mg total) by mouth every 6 (six) hours as needed for moderate pain or cramping. ?What changed: reasons to take this ?  ?Prenatal Vitamin 27-0.8 MG Tabs ?Prenatal Vitamin ?  ?valACYclovir 1000 MG tablet ?Commonly known as: VALTREX ?SMARTSIG:1 Tablet(s) By Mouth Every 12  Hours ?  ? ?  ? ? ? ?Discharge home in stable condition ?Infant Feeding: Breast ?Infant Disposition:home with mother ?Discharge instruction: per After Visit Summary and Postpartum booklet. ?Activity: Advance as tolerated. Pelvic rest for 6 weeks.  ?Diet: routine diet ?Anticipated Birth Control: Unsure ?Postpartum Appointment:6 weeks ?Additional Postpartum F/U: Postpartum Depression checkup ?Future Appointments:No future appointments. ?Follow up Visit: ? Follow-up Information   ? ? [facility] [employer]. Schedule an appointment as soon as possible for a visit in 6 week(s).   ?Why: For postpartum visit ?Contact information: ?[address]  ?[address] ?[place] [address] ?[phone] ? ? ?  ?  ? ?  ?  ? ?  ? ? ? ?  ? ?12/08/2021 ?Cathrine Muster, DO ? ? ?

## 2021-12-08 NOTE — Progress Notes (Signed)
Post Partum Day 2 ?Subjective: ?up ad lib, voiding, tolerating PO, + flatus, and lochia mild. Report mild low back achy pain; no pinpoint tenderness at epidural site. She is bonding well with baby. Feels ready for discharge to home today after circumcision done  ? ?Objective: ?Blood pressure 110/75, pulse 86, temperature 98.6 ?F (37 ?C), temperature source Oral, resp. rate 18, height 5\' 6"  (1.676 m), weight 105.1 kg, SpO2 100 %, unknown if currently breastfeeding. ? ?Physical Exam:  ?General: alert, cooperative, and no distress ?Lochia: appropriate ?Uterine Fundus: firm ?Incision: n/a ?DVT Evaluation: No evidence of DVT seen on physical exam. ?Calf/Ankle edema is present. ? ?Recent Labs  ?  12/06/21 ?0031 12/07/21 ?0451  ?HGB 10.9* 10.4*  ?HCT 33.9* 32.3*  ? ? ?Assessment/Plan: ?Discharge home and Circumcision prior to discharge ?F/U in 6 weeks ? ? LOS: 2 days  ? ?12/09/21 ?12/08/2021, 11:37 AM  ? ? ?

## 2021-12-08 NOTE — Discharge Instructions (Signed)
Call office with any concerns [phone] 

## 2021-12-08 NOTE — Lactation Note (Signed)
This note was copied from a baby's chart. ?Lactation Consultation Note ? ?Patient Name: Tamara Foster ?Today's Date: 12/08/2021 ?Reason for consult: Follow-up assessment;Term;Infant weight loss;Other (Comment) (1 % weight loss, baby last fed 0830 and took 45 ml,woke up when [facility] was in the room . Mom receptive to see if the baby would latch/ 1st STS ,would stay for a few sucks, and LC added a #24 NS - latched longer and fell asleep. will F/U) ?Age:31 hours ?Mom to call for feeding cues for Latch assist with the NS.  ?Goal is to get the baby to sustain the latch with depth.  ?Maternal Data ?  ? ?Feeding ?Mother's Current Feeding Choice: Breast Milk and Formula ?Nipple Type: Nfant Standard Flow (white) ? ?LATCH Score ?Latch: Repeated attempts needed to sustain latch, nipple held in mouth throughout feeding, stimulation needed to elicit sucking reflex. ? ?Audible Swallowing: None ? ?Type of Nipple: Everted at rest and after stimulation (areola edema) ? ?Comfort (Breast/Nipple): Soft / non-tender ? ?Hold (Positioning): Assistance needed to correctly position infant at breast and maintain latch. ? ?LATCH Score: 6 ? ? ?Lactation Tools Discussed/Used ?Tools: Shells;Pump;Flanges;Nipple Dorris Carnes ?Nipple shield size: 24;Other (comment) (good fit) ?Breast pump type: Manual;Double-Electric Breast Pump ? ?Interventions ?  ? ?Discharge ?Discharge Education: Engorgement and breast care;Warning signs for feeding baby;Outpatient recommendation ?Pump: DEBP;Personal ? ?Consult Status ?Consult Status: Follow-up ?Date: 12/08/21 ?Follow-up type: In-patient ? ? ? ?Matilde Sprang Majesta Leichter ?12/08/2021, 10:19 AM ? ? ? ?

## 2021-12-16 ENCOUNTER — Telehealth (HOSPITAL_COMMUNITY): Payer: Self-pay

## 2021-12-16 NOTE — Telephone Encounter (Signed)
No answer. Left message to return nurse call. ? Heron Nay ?12/16/2021,1116 ?

## 2022-02-23 ENCOUNTER — Other Ambulatory Visit: Payer: Self-pay

## 2022-02-23 ENCOUNTER — Encounter (HOSPITAL_BASED_OUTPATIENT_CLINIC_OR_DEPARTMENT_OTHER): Payer: Self-pay | Admitting: Emergency Medicine

## 2022-02-23 ENCOUNTER — Emergency Department (HOSPITAL_BASED_OUTPATIENT_CLINIC_OR_DEPARTMENT_OTHER): Payer: BC Managed Care – PPO

## 2022-02-23 ENCOUNTER — Emergency Department (HOSPITAL_BASED_OUTPATIENT_CLINIC_OR_DEPARTMENT_OTHER)
Admission: EM | Admit: 2022-02-23 | Discharge: 2022-02-23 | Disposition: A | Discharge status: Home / Self Care | Payer: BC Managed Care – PPO | Attending: Emergency Medicine | Admitting: Emergency Medicine

## 2022-02-23 DIAGNOSIS — Y92410 Unspecified street and highway as the place of occurrence of the external cause: Secondary | ICD-10-CM | POA: Insufficient documentation

## 2022-02-23 DIAGNOSIS — M546 Pain in thoracic spine: Secondary | ICD-10-CM | POA: Insufficient documentation

## 2022-02-23 MED ORDER — ACETAMINOPHEN 500 MG PO TABS
1000.0000 mg | ORAL_TABLET | Freq: Once | ORAL | Status: AC
  Administered 2022-02-23: 1000 mg via ORAL
  Filled 2022-02-23: qty 2

## 2022-02-23 MED ORDER — LIDOCAINE 5 % EX PTCH
1.0000 | MEDICATED_PATCH | CUTANEOUS | Status: DC
  Administered 2022-02-23: 1 via TRANSDERMAL
  Filled 2022-02-23: qty 1

## 2022-02-23 MED ORDER — LIDOCAINE 5 % EX PTCH: 1.0000 | MEDICATED_PATCH | CUTANEOUS | 0 refills | Status: AC

## 2022-02-23 MED ORDER — NAPROXEN 375 MG PO TABS: 375.0000 mg | ORAL_TABLET | Freq: Two times a day (BID) | ORAL | 0 refills | Status: AC

## 2022-02-23 MED ORDER — IBUPROFEN 800 MG PO TABS
800.0000 mg | ORAL_TABLET | Freq: Once | ORAL | Status: AC
Start: 2022-02-23 — End: 2022-02-23
  Administered 2022-02-23: 800 mg via ORAL
  Filled 2022-02-23: qty 1
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
## 2022-03-30 ENCOUNTER — Ambulatory Visit: Payer: BC Managed Care – PPO | Admitting: Orthopaedic Surgery

## 2023-09-24 NOTE — Congregational Nurse Program (Signed)
Met with client and her son Ace A. West [birth year] 2023 on this afternoon at the [facility].  Mom very quiet and reserved. Both she and her son have a PCP. Son goes to [facility] Shelly Flatten.  States that he is behind on immunizations and last Well Child check up.  He has a history of Asthma and has a nebulizer that she uses albuterol and ? Med she didn't know the name of.  She herself has a history of Eczema that she uses a cream for.  Mom states she has meds and no need for any of the.Mom just started a new job.  She needs to check on Daycare Voucher.  She thinks it's still valid but not sure. Plan:  Nurse encouraged mom to make appt. For Ace for his wellness visit and vaccines.  Call DSS to check to see if voucher is still valid for Daycare.  Nurse will follow up as needed.  Bohdan Macho D. Azucena Kuba MSN, Chartered loss adjuster Nurse [employer] [phone]
